# Patient Record
Sex: Female | Born: 1985 | Race: White | Hispanic: No | Marital: Single | Smoking: Current every day smoker
Health system: Southern US, Community
[De-identification: ages and names within clinical notes are randomized; demographics above are authoritative.]

## PROBLEM LIST (undated history)

## (undated) DIAGNOSIS — C539 Malignant neoplasm of cervix uteri, unspecified: Secondary | ICD-10-CM

## (undated) DIAGNOSIS — N946 Dysmenorrhea, unspecified: Secondary | ICD-10-CM

## (undated) DIAGNOSIS — N920 Excessive and frequent menstruation with regular cycle: Secondary | ICD-10-CM

## (undated) HISTORY — PX: CERVICAL BIOPSY  W/ LOOP ELECTRODE EXCISION: SUR135

## (undated) HISTORY — DX: Excessive and frequent menstruation with regular cycle: N92.0

## (undated) HISTORY — PX: FRACTURE SURGERY: SHX138

## (undated) HISTORY — DX: Dysmenorrhea, unspecified: N94.6

---

## 2005-11-03 ENCOUNTER — Ambulatory Visit: Payer: Self-pay | Admitting: Family Medicine

## 2006-01-13 ENCOUNTER — Ambulatory Visit: Payer: Self-pay | Admitting: Nurse Practitioner

## 2006-11-20 ENCOUNTER — Emergency Department: Payer: Self-pay | Admitting: Unknown Physician Specialty

## 2008-06-08 ENCOUNTER — Emergency Department: Payer: Self-pay | Admitting: Emergency Medicine

## 2010-01-31 ENCOUNTER — Emergency Department: Payer: Self-pay | Admitting: Emergency Medicine

## 2011-09-05 ENCOUNTER — Emergency Department: Payer: Self-pay | Admitting: Emergency Medicine

## 2013-09-03 ENCOUNTER — Ambulatory Visit: Payer: Self-pay

## 2013-11-11 ENCOUNTER — Ambulatory Visit: Payer: Self-pay | Admitting: Obstetrics and Gynecology

## 2013-11-11 LAB — HEMOGLOBIN: HGB: 13.3 g/dL (ref 12.0–16.0)

## 2013-11-17 ENCOUNTER — Ambulatory Visit: Payer: Self-pay | Admitting: Obstetrics and Gynecology

## 2013-11-21 LAB — PATHOLOGY REPORT

## 2014-05-22 ENCOUNTER — Emergency Department: Payer: Self-pay | Admitting: Emergency Medicine

## 2014-11-28 NOTE — Op Note (Signed)
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  OPERATION: LEEP  ANESTHESIA: General.   SURGEON: Dr. Rubie Maid  ESTIMATED BLOOD LOSS:  Minimal.   OPERATIVE FLUIDS: 700 mL.   URINE OUTPUT: 300 mL.   COMPLICATIONS: None.   FINDINGS: Decreased Lugol uptake at 9 o'clock and 12 o'clock regions.   SPECIMEN: LEEP specimen (endocervix and ectocervix), and ECC.   CONDITION: Stable.   DESCRIPTION OF PROCEDURE: The patient was taken to the Operating Room, where she was placed under general anesthesia without difficulty. She was then placed in the dorsal lithotomy position, and prepped and draped in normal sterile fashion. A straight catheterization was performed. A Graves speculum was then placed in the vagina. Lugol solution was painted along the entire cervix and vaginal wall. Areas of non-uptake were noted as above. Approximately 10 mL of diluted vasopressin with epinephrine in a one-to-one ratio was injected circumferentially along the cervix. The large loop electrode was used to remove the specimen, which was excised and sent to Pathology. The smallest loop electrode was obtained to obtain a top hat for excision of the endocervix. Next, an endocervical curettage was performed. The specimen was placed on a Telfa pad and sent to Pathology. The bed of the excised cervical tissue along the cervix was cauterized using the rollerball. Hemostasis was noted. Monsel solution was applied to the cervix. All instruments were removed from the vagina.  The patient tolerated the procedure well, without complication. She was taken to the recovery room in stable condition.    ____________________________ Chesley Noon Marcelline Mates, MD asc:mr D: 11/17/2013 16:10:29 ET T: 11/17/2013 20:19:35 ET JOB#: [id number]  cc: Chesley Noon. Marcelline Mates, MD, <Dictator> Augusto Gamble  MD ELECTRONICALLY SIGNED 11/23/2013 17:40

## 2015-06-02 ENCOUNTER — Ambulatory Visit
Admission: EM | Admit: 2015-06-02 | Discharge: 2015-06-02 | Disposition: A | Discharge status: Home / Self Care | Payer: Self-pay | Attending: Family Medicine | Admitting: Family Medicine

## 2015-06-02 DIAGNOSIS — N390 Urinary tract infection, site not specified: Secondary | ICD-10-CM

## 2015-06-02 DIAGNOSIS — A599 Trichomoniasis, unspecified: Secondary | ICD-10-CM

## 2015-06-02 HISTORY — DX: Malignant neoplasm of cervix uteri, unspecified: C53.9

## 2015-06-02 LAB — URINALYSIS COMPLETE WITH MICROSCOPIC (ARMC ONLY)
BILIRUBIN URINE: NEGATIVE
GLUCOSE, UA: NEGATIVE mg/dL
Ketones, ur: NEGATIVE mg/dL
Nitrite: NEGATIVE
PH: 7 (ref 5.0–8.0)
Protein, ur: 30 mg/dL — AB
Specific Gravity, Urine: 1.015 (ref 1.005–1.030)

## 2015-06-02 LAB — PREGNANCY, URINE: Preg Test, Ur: NEGATIVE

## 2015-06-02 MED ORDER — METRONIDAZOLE 500 MG PO TABS: ORAL_TABLET | ORAL | Status: DC

## 2015-06-02 MED ORDER — CIPROFLOXACIN HCL 250 MG PO TABS: 250.0000 mg | ORAL_TABLET | Freq: Two times a day (BID) | ORAL | Status: DC

## 2015-06-02 MED ORDER — ONDANSETRON 8 MG PO TBDP
8.0000 mg | ORAL_TABLET | Freq: Once | ORAL | Status: AC
  Administered 2015-06-02: 8 mg via ORAL

## 2015-06-02 MED ORDER — ONDANSETRON 8 MG PO TBDP: 8.0000 mg | ORAL_TABLET | Freq: Three times a day (TID) | ORAL | Status: DC | PRN

## 2015-06-02 NOTE — ED Provider Notes (Signed)
CSN: [id number]     Arrival date & time 06/02/15  1812 History   First MD Initiated Contact with Patient 06/02/15 1911     Chief Complaint  Patient presents with  . Nausea   (Consider location/radiation/quality/duration/timing/severity/associated sxs/prior Treatment) HPI Comments: 29 yo female with a 2 days h/o fatigue, lower abdominal discomfort and nausea. Denies any fevers, chills, vaginal discharge, vomiting, diarrhea, constipation. Possible sick contact; states father had a gastrointestinal infection last week with vomiting and diarrhea.  The history is provided by the patient.    Past Medical History  Diagnosis Date  . Cervical cancer [facility])    Past Surgical History  Procedure Laterality Date  . Cervical biopsy  w/ loop electrode excision    . Fracture surgery     No family history on file. Social History  Substance Use Topics  . Smoking status: Current Every Day Smoker -- 0.50 packs/day  . Smokeless tobacco: Never Used  . Alcohol Use: Yes     Comment: rare   OB History    Gravida Para Term Preterm AB TAB SAB Ectopic Multiple Living   0 0 0 0 0 0 0 0 0 0      Review of Systems  Allergies  Codeine  Home Medications   Prior to Admission medications   Medication Sig Start Date End Date Taking? Authorizing Provider  ciprofloxacin (CIPRO) 250 MG tablet Take 1 tablet (250 mg total) by mouth every 12 (twelve) hours. 06/02/15   Norval Gable, MD  metroNIDAZOLE (FLAGYL) 500 MG tablet Take 4 tabs po once 06/02/15   Norval Gable, MD  ondansetron (ZOFRAN ODT) 8 MG disintegrating tablet Take 1 tablet (8 mg total) by mouth every 8 (eight) hours as needed for nausea or vomiting. 06/02/15   Norval Gable, MD   Meds Ordered and Administered this Visit   Medications  ondansetron (ZOFRAN-ODT) disintegrating tablet 8 mg (8 mg Oral Given 06/02/15 1910)    BP 110/71 mmHg  Pulse 66  Temp(Src) 98.6 F (37 C) (Oral)  Resp 16  Ht 5\' 6"  (1.676 m)  Wt 209 lb 9.6 oz (95.074 kg)   BMI 33.85 kg/m2  SpO2 100%  LMP 05/18/2015 No data found.   Physical Exam  Constitutional: She appears well-developed and well-nourished. No distress.  Cardiovascular: Normal rate.   Pulmonary/Chest: Effort normal. No respiratory distress.  Abdominal: Soft. Bowel sounds are normal. She exhibits no distension and no mass. There is no tenderness. There is no rebound and no guarding.  Skin: No rash noted. She is not diaphoretic.  Nursing note and vitals reviewed.   ED Course  Procedures (including critical care time)  Labs Review Labs Reviewed  URINALYSIS COMPLETEWITH MICROSCOPIC ([place] ONLY) - Abnormal; Notable for the following:    Color, Urine STRAW (*)    Hgb urine dipstick 2+ (*)    Protein, ur 30 (*)    Leukocytes, UA 1+ (*)    Bacteria, UA FEW (*)    Squamous Epithelial / LPF 0-5 (*)    All other components within normal limits  URINE CULTURE  CHLAMYDIA/NGC RT PCR ([facility] ONLY)  PREGNANCY, URINE    Imaging Review No results found.   Visual Acuity Review  Right Eye Distance:   Left Eye Distance:   Bilateral Distance:    Right Eye Near:   Left Eye Near:    Bilateral Near:         MDM   1. Trichomonas infection   2. UTI (lower urinary tract infection)  Discharge Medication List as of 06/02/2015  8:02 PM    START taking these medications   Details  ciprofloxacin (CIPRO) 250 MG tablet Take 1 tablet (250 mg total) by mouth every 12 (twelve) hours., Starting 06/02/2015, Until Discontinued, Print    metroNIDAZOLE (FLAGYL) 500 MG tablet Take 4 tabs po once, Print    ondansetron (ZOFRAN ODT) 8 MG disintegrating tablet Take 1 tablet (8 mg total) by mouth every 8 (eight) hours as needed for nausea or vomiting., Starting 06/02/2015, Until Discontinued, Print      1. Lab results and diagnosis reviewed with patient 2. rx as per orders above; reviewed possible side effects, in eractions, risks and benefits  3. Recommend supportive treatment with  increased fluids; recommend patient notify partner to get checked as well 4. Check urine culture, GC/chlamydia 5. Patient given zofran 8mg  po x 1 with improvement of nausea 6. Follow-up prn if symptoms worsen or don't improve    Norval Gable, MD 06/02/15 2032

## 2015-06-02 NOTE — ED Notes (Signed)
Nausea, no appetite, lower bilateral abdominal/back pain, pallor, fatigue, lightheadedness, chills/sweats, insomnia x 2 days. Pt reports her father has been sick, and pt is taking Mucinex and Airborne to prevent illness. Pt reports her period on 05/28/2015, which lasted longer and was heavier than usual. Pt feels this may be related to her current illness.

## 2015-06-03 LAB — CHLAMYDIA/NGC RT PCR (ARMC ONLY)
CHLAMYDIA TR: DETECTED — AB
N gonorrhoeae: NOT DETECTED

## 2015-06-04 ENCOUNTER — Ambulatory Visit
Admission: EM | Admit: 2015-06-04 | Discharge: 2015-06-04 | Disposition: A | Discharge status: Home / Self Care | Payer: Self-pay | Attending: Family Medicine | Admitting: Family Medicine

## 2015-06-04 ENCOUNTER — Telehealth: Payer: Self-pay | Admitting: Emergency Medicine

## 2015-06-04 LAB — URINE CULTURE: Special Requests: NORMAL

## 2015-06-04 MED ORDER — AZITHROMYCIN 250 MG PO TABS: ORAL_TABLET | ORAL | Status: DC

## 2015-06-04 MED ORDER — AZITHROMYCIN 500 MG PO TABS: 1000.0000 mg | ORAL_TABLET | Freq: Once | ORAL | Status: DC

## 2015-06-04 NOTE — ED Notes (Signed)
Patient was notified that her Chlamydia test came back positive.  Patient was instructed to continue to take her Cipro and Flagyl until finished.  Patient was notified that a prescription for Azithromycin was sent to her pharmacy at [facility] in [facility] by Dr. Zenda Alpers.  Patient was instructed to take her antibiotic as soon as possible to treat for her Chlamydia infection.  Patient states that her abdominal pain has not improved.  Patient was instructed that she could follow-up here or with her PCP for further evaluation.  Patient also was informed that her urine culture was not growing any bacteria at this time.  Patient verbalized understanding.

## 2015-06-04 NOTE — ED Notes (Signed)
Patient here today to receive her dose of Azithromycin 1000mg  once to treat for her positive Chlamydia infection.

## 2016-05-01 DIAGNOSIS — O3440 Maternal care for other abnormalities of cervix, unspecified trimester: Secondary | ICD-10-CM | POA: Insufficient documentation

## 2016-05-01 DIAGNOSIS — Z9889 Other specified postprocedural states: Secondary | ICD-10-CM

## 2016-05-01 DIAGNOSIS — Z3400 Encounter for supervision of normal first pregnancy, unspecified trimester: Secondary | ICD-10-CM | POA: Insufficient documentation

## 2016-05-30 DIAGNOSIS — O99330 Smoking (tobacco) complicating pregnancy, unspecified trimester: Secondary | ICD-10-CM | POA: Insufficient documentation

## 2016-06-06 ENCOUNTER — Emergency Department
Admission: EM | Admit: 2016-06-06 | Discharge: 2016-06-06 | Disposition: A | Discharge status: Home / Self Care | Payer: Medicaid Other | Attending: Emergency Medicine | Admitting: Emergency Medicine

## 2016-06-06 ENCOUNTER — Encounter: Payer: Self-pay | Admitting: Emergency Medicine

## 2016-06-06 ENCOUNTER — Emergency Department: Payer: Medicaid Other

## 2016-06-06 DIAGNOSIS — Z792 Long term (current) use of antibiotics: Secondary | ICD-10-CM | POA: Diagnosis not present

## 2016-06-06 DIAGNOSIS — Z8541 Personal history of malignant neoplasm of cervix uteri: Secondary | ICD-10-CM | POA: Diagnosis not present

## 2016-06-06 DIAGNOSIS — F17200 Nicotine dependence, unspecified, uncomplicated: Secondary | ICD-10-CM | POA: Insufficient documentation

## 2016-06-06 DIAGNOSIS — Z3A14 14 weeks gestation of pregnancy: Secondary | ICD-10-CM | POA: Diagnosis not present

## 2016-06-06 DIAGNOSIS — O200 Threatened abortion: Secondary | ICD-10-CM | POA: Diagnosis not present

## 2016-06-06 DIAGNOSIS — O4692 Antepartum hemorrhage, unspecified, second trimester: Secondary | ICD-10-CM | POA: Diagnosis present

## 2016-06-06 LAB — URINALYSIS COMPLETE WITH MICROSCOPIC (ARMC ONLY)
Bilirubin Urine: NEGATIVE
Glucose, UA: NEGATIVE mg/dL
Ketones, ur: NEGATIVE mg/dL
LEUKOCYTES UA: NEGATIVE
Nitrite: NEGATIVE
PH: 7 (ref 5.0–8.0)
PROTEIN: 30 mg/dL — AB
Specific Gravity, Urine: 1.015 (ref 1.005–1.030)

## 2016-06-06 LAB — CBC
HEMATOCRIT: 36.7 % (ref 35.0–47.0)
Hemoglobin: 13 g/dL (ref 12.0–16.0)
MCH: 31.5 pg (ref 26.0–34.0)
MCHC: 35.6 g/dL (ref 32.0–36.0)
MCV: 88.6 fL (ref 80.0–100.0)
PLATELETS: 384 10*3/uL (ref 150–440)
RBC: 4.14 MIL/uL (ref 3.80–5.20)
RDW: 13.7 % (ref 11.5–14.5)
WBC: 12.5 10*3/uL — AB (ref 3.6–11.0)

## 2016-06-06 LAB — BASIC METABOLIC PANEL
Anion gap: 9 (ref 5–15)
BUN: 6 mg/dL (ref 6–20)
CO2: 23 mmol/L (ref 22–32)
Calcium: 9.1 mg/dL (ref 8.9–10.3)
Chloride: 103 mmol/L (ref 101–111)
Creatinine, Ser: 0.37 mg/dL — ABNORMAL LOW (ref 0.44–1.00)
GFR calc Af Amer: >60 mL/min (ref >60)
GLUCOSE: 97 mg/dL (ref 65–99)
POTASSIUM: 3.3 mmol/L — AB (ref 3.5–5.1)
Sodium: 135 mmol/L (ref 135–145)

## 2016-06-06 LAB — HCG, QUANTITATIVE, PREGNANCY: hCG, Beta Chain, Quant, S: 39857 m[IU]/mL — ABNORMAL HIGH (ref <5)

## 2016-06-06 NOTE — ED Notes (Signed)
Patient transported to US 

## 2016-06-06 NOTE — ED Notes (Signed)
Pt stating that she has had bright red blood. Pt stating that she believes it started around 4 am. Pt stating that she noticed the blood in the toilet and on her bed sheets. Pt stating that she had some spotting before this. Pt stating that she has not had any morning sickness. Pt is [redacted] weeks pregnant. Pt stating that she has some lower abdominal pain and cramping with coughing.

## 2016-06-06 NOTE — ED Notes (Signed)
Signature pad not working.  Patient verbalized understanding of discharge instructions and has no further questions. 

## 2016-06-06 NOTE — ED Provider Notes (Signed)
[facility] Emergency Department Provider Note        Time seen: ----------------------------------------- 11:52 AM on 06/06/2016 -----------------------------------------    I have reviewed the triage vital signs and the nursing notes.   HISTORY  Chief Complaint Vaginal Bleeding    HPI Lauren Johnson is a 30 y.o. female who presents to ER for vaginal bleeding that began at 4 AM this morning. Patient reports she is [redacted] weeks pregnant, woke up bleeding. She reports when she urinated in the toilet that there was a lot of blood in the toilet but denies clots. Patient states she had been spotting previously with this pregnancy but never bleeding briskly like today. Patient is had some cramping but this has improved. She is G1 P0   Past Medical History:  Diagnosis Date  . Cervical cancer ([place])     There are no active problems to display for this patient.   Past Surgical History:  Procedure Laterality Date  . CERVICAL BIOPSY  W/ LOOP ELECTRODE EXCISION    . FRACTURE SURGERY      Allergies Codeine and Latex  Social History Social History  Substance Use Topics  . Smoking status: Current Every Day Smoker    Packs/day: 0.50  . Smokeless tobacco: Never Used  . Alcohol use Yes     Comment: rare    Review of Systems Constitutional: Negative for fever. Cardiovascular: Negative for chest pain. Respiratory: Negative for shortness of breath. Gastrointestinal: Negative for abdominal pain, vomiting and diarrhea. Genitourinary: Positive for pelvic pain, vaginal bleeding Musculoskeletal: Negative for back pain. Skin: Negative for rash. Neurological: Negative for headaches, focal weakness or numbness.  10-point ROS otherwise negative.  ____________________________________________   PHYSICAL EXAM:  VITAL SIGNS: ED Triage Vitals  Enc Vitals Group     BP 06/06/16 1007 125/79     Pulse Rate 06/06/16 1007 80     Resp 06/06/16 1007 16     Temp  06/06/16 1007 98.1 F (36.7 C)     Temp Source 06/06/16 1007 Oral     SpO2 06/06/16 1007 99 %     Weight 06/06/16 1008 224 lb (101.6 kg)     Height 06/06/16 1008 5\' 6"  (1.676 m)     Head Circumference --      Peak Flow --      Pain Score 06/06/16 1016 9     Pain Loc --      Pain Edu? --      Excl. in [place]? --     Constitutional: Alert and oriented. Well appearing and in no distress. Eyes: Conjunctivae are normal. PERRL. Normal extraocular movements. ENT   Head: Normocephalic and atraumatic.   Nose: No congestion/rhinnorhea.   Mouth/Throat: Mucous membranes are moist.   Neck: No stridor. Cardiovascular: Normal rate, regular rhythm. No murmurs, rubs, or gallops. Respiratory: Normal respiratory effort without tachypnea nor retractions. Breath sounds are clear and equal bilaterally. No wheezes/rales/rhonchi. Gastrointestinal: Soft and nontender. Normal bowel sounds Musculoskeletal: Nontender with normal range of motion in all extremities. No lower extremity tenderness nor edema. Neurologic:  Normal speech and language. No gross focal neurologic deficits are appreciated.  Skin:  Skin is warm, dry and intact. No rash noted. Psychiatric: Mood and affect are normal. Speech and behavior are normal.  ____________________________________________  ED COURSE:  Pertinent labs & imaging results that were available during my care of the patient were reviewed by me and considered in my medical decision making (see chart for details). Clinical Course  Patients in no distress, likely threatened miscarriage. We will assess with labs and ultrasound.  Procedures ____________________________________________   LABS (pertinent positives/negatives)  Labs Reviewed  HCG, QUANTITATIVE, PREGNANCY - Abnormal; Notable for the following:       Result Value   hCG, Beta Chain, Quant, S [id number] (*)    All other components within normal limits  CBC - Abnormal; Notable for the following:    WBC  12.5 (*)    All other components within normal limits  BASIC METABOLIC PANEL - Abnormal; Notable for the following:    Potassium 3.3 (*)    Creatinine, Ser 0.37 (*)    All other components within normal limits  URINALYSIS COMPLETEWITH MICROSCOPIC ([facility] ONLY)    RADIOLOGY  Pregnancy ultrasound IMPRESSION: There is single live intrauterine gestation fetal heart rate 130 BPM. No placenta previa. BPD measures 2.7 cm corresponding to gestational age [redacted] weeks 6 days. EDC by ultrasound 11/29/2016  This exam is performed on an emergent basis and does not comprehensively evaluate fetal size, dating, or anatomy; follow-up complete OB US should be considered if further fetal assessment is warranted.   Electronically Signed   By: Lahoma Crocker M.D.   On: 06/06/2016 12:53  ____________________________________________  FINAL ASSESSMENT AND PLAN  Threatened miscarriage  Plan: Patient with labs and imaging as dictated above. No clear etiology for her bleeding. She will be referred to close outpatient OB/GYN follow-up.   Earleen Newport, MD   Note: This dictation was prepared with Dragon dictation. Any transcriptional errors that result from this process are unintentional    Earleen Newport, MD 06/06/16 1316

## 2016-06-06 NOTE — ED Notes (Signed)
Pt returned from US

## 2016-06-06 NOTE — ED Triage Notes (Signed)
Pt presents to ED with reports of vaginal bleeding that began approximately 0400 this morning. Pt reports she is [redacted] weeks pregnant. Pt states she woke up in a pool of blood. Pt reports when she urinated the toilet was filled with blood but denies clots.

## 2016-06-10 LAB — OB RESULTS CONSOLE HGB/HCT, BLOOD
HCT: 39 %
Hemoglobin: 12.6 g/dL

## 2016-06-10 LAB — OB RESULTS CONSOLE RUBELLA ANTIBODY, IGM: RUBELLA: IMMUNE

## 2016-06-10 LAB — OB RESULTS CONSOLE ABO/RH
RH TYPE: POSITIVE
RH Type: NEGATIVE

## 2016-06-10 LAB — OB RESULTS CONSOLE GC/CHLAMYDIA
Chlamydia: NEGATIVE
GC PROBE AMP, GENITAL: NEGATIVE

## 2016-06-10 LAB — OB RESULTS CONSOLE PLATELET COUNT: Platelets: 394 10*3/uL

## 2016-06-10 LAB — OB RESULTS CONSOLE ANTIBODY SCREEN: ANTIBODY SCREEN: NEGATIVE

## 2016-06-10 LAB — OB RESULTS CONSOLE RPR: RPR: NONREACTIVE

## 2016-06-10 LAB — OB RESULTS CONSOLE HIV ANTIBODY (ROUTINE TESTING): HIV: NONREACTIVE

## 2016-06-13 DIAGNOSIS — O4692 Antepartum hemorrhage, unspecified, second trimester: Secondary | ICD-10-CM | POA: Insufficient documentation

## 2016-06-16 DIAGNOSIS — O285 Abnormal chromosomal and genetic finding on antenatal screening of mother: Secondary | ICD-10-CM | POA: Insufficient documentation

## 2016-06-22 DIAGNOSIS — O280 Abnormal hematological finding on antenatal screening of mother: Secondary | ICD-10-CM | POA: Insufficient documentation

## 2016-08-07 NOTE — L&D Delivery Note (Signed)
Cesarean Section Procedure Note  Indications: failure to progress: arrest of dilation and Preeclampsia  Pre-operative Diagnosis: 39 week 4 day pregnancy.  Post-operative Diagnosis: same and uterine atony  Surgeon: Alanda Slim Charmine Bockrath   Assistants: Philip Aspen, certified nurse midwife  Anesthesia: Spinal anesthesia  ASA Class: 2   Procedure Details   The patient was seen in the Holding Room. The risks, benefits, complications, treatment options, and expected outcomes were discussed with the patient.  The patient concurred with the proposed plan, giving informed consent.  The site of surgery properly noted/marked. The patient was taken to Operating Room # 1, identified as Lauren Johnson and the procedure verified as C-Section Delivery. A Time Out was held and the above information confirmed.  After induction of spinal anesthesia, the patient was draped and prepped in the usual sterile manner. A Pfannenstiel incision was made and carried down through the subcutaneous tissue to the fascia. Fascial incision was made and extended transversely. The fascia was separated.The peritoneum was identified and entered. Peritoneal incision was extended longitudinally. The utero-vesical peritoneal reflection was incised transversely and the bladder flap was bluntly freed from the lower uterine segment. A low transverse uterine incision was made. Delivered from cephalic presentation was a 2430 gram Female with Apgar scores of 8 at one minute and 9 at five minutes. After the umbilical cord was clamped and cut cord blood was obtained for evaluation. The placenta was removed intact and appeared normal. The uterine outline, tubes and ovaries appeared normal. The uterine incision was closed with running locked sutures of #1 chromic. Uterine atony was noted. Initial attempt at Cytotec rectally was unsuccessful; 250 g of Hemabate was then given intra muscularly with improvement in uterine tone. The fascia was  then reapproximated with running sutures of 0 Maxon. The subcutaneous tissues were reapproximated using 2-0 Vicryl in a running manner The skin was reapproximated with 3.0 and Vicryl.  Instrument, sponge, and needle counts were correct prior the abdominal closure and at the conclusion of the case.   Findings: Viable female infant 2430 g (5 lbs. 6 oz.)  Estimated Blood Loss:  750 mL         Drains: None         Total IV Fluids:  2000 ml         Specimens: None           Implants: None         Complications:  None; patient tolerated the procedure well.         Disposition: PACU - hemodynamically stable.         Condition: stable  Attending Attestation: I performed the procedure.  Brayton Mars, MD  Note: This dictation was prepared with Dragon dictation along with smaller phrase technology. Any transcriptional errors that result from this process are unintentional.

## 2016-08-15 ENCOUNTER — Ambulatory Visit (INDEPENDENT_AMBULATORY_CARE_PROVIDER_SITE_OTHER): Payer: Medicaid Other | Admitting: Certified Nurse Midwife

## 2016-08-15 ENCOUNTER — Encounter: Payer: Self-pay | Admitting: Certified Nurse Midwife

## 2016-08-15 VITALS — BP 137/65 | HR 93 | Wt 225.1 lb

## 2016-08-15 DIAGNOSIS — Z3402 Encounter for supervision of normal first pregnancy, second trimester: Secondary | ICD-10-CM

## 2016-08-15 LAB — POCT URINALYSIS DIPSTICK
GLUCOSE UA: NEGATIVE
Protein, UA: NEGATIVE
SPEC GRAV UA: 1.015
pH, UA: 5

## 2016-08-15 NOTE — Progress Notes (Signed)
Pt is a 23 week transfer. Records have been scanned.

## 2016-08-15 NOTE — Progress Notes (Signed)
TRANSFER IN OB HISTORY AND PHYSICAL  SUBJECTIVE:       Lauren Johnson is a 31 y.o. G24P0000 female, Patient's last menstrual period was 02/21/2016., Estimated Date of Delivery: 11/27/2016., presents today for Transition of Prenatal Care.   Previously a patient of [facility]. [name] has moved back to [facility] and desires care close to home.   EPIC data migration from outside records is accomplished today.  Complaints today include: numbness and tingling in hands.  Gynecologic History Patient's last menstrual period was 02/21/2016. Normal   Contraception: Depo-Provera injections   Last Pap: 06/10/2016. Results were: normal  LEEP: 2013 or 2014.   Obstetric History OB History  Gravida Para Term Preterm AB Living  1 0 0 0 0 0  SAB TAB Ectopic Multiple Live Births  0 0 0 0      # Outcome Date GA Lbr Len/2nd Weight Sex Delivery Anes PTL Lv  1 Current               Past Medical History:  Diagnosis Date  . Cervical cancer [facility])     Past Surgical History:  Procedure Laterality Date  . CERVICAL BIOPSY  W/ LOOP ELECTRODE EXCISION    . FRACTURE SURGERY      Current Outpatient Prescriptions on File Prior to Visit  Medication Sig Dispense Refill  . ondansetron (ZOFRAN ODT) 8 MG disintegrating tablet Take 1 tablet (8 mg total) by mouth every 8 (eight) hours as needed for nausea or vomiting. 6 tablet 0   No current facility-administered medications on file prior to visit.     Allergies  Allergen Reactions  . Codeine Hives  . Latex Other (See Comments)    Throat swells    Social History   Social History  . Marital status: Single    Spouse name: N/A  . Number of children: N/A  . Years of education: N/A   Occupational History  . Not on file.   Social History Main Topics  . Smoking status: Current Every Day Smoker    Packs/day: 0.50  . Smokeless tobacco: Never Used  . Alcohol use Yes     Comment: rare  . Drug use: No  . Sexual activity: Not on  file   Other Topics Concern  . Not on file   Social History Narrative  . No narrative on file    No family history on file.  The following portions of the patient's history were reviewed and updated as appropriate: allergies, current medications, past OB history, past medical history, past surgical history, past family history, past social history, and problem list.    OBJECTIVE: Initial Physical Exam (New OB)  GENERAL APPEARANCE: alert, well appearing, in no apparent distress  HEAD: normocephalic, atraumatic  MOUTH: mucous membranes moist, pharynx normal without lesions  THYROID: Not Examined  BREASTS: not examined  LUNGS: clear to auscultation, no wheezes, rales or rhonchi, symmetric air entry  HEART: regular rate and rhythm, no murmurs  ABDOMEN: fundus soft, nontender 25 weeks size and FHT present  EXTREMITIES: no redness or tenderness in the calves or thighs  SKIN: normal coloration and turgor, no rashes, four (4) professional and one (1) unprofessional tattoo  LYMPH NODES: not examined  NEUROLOGIC: alert, oriented, normal speech, no focal findings or movement disorder noted  PELVIC EXAM not examined  CERVICAL LENGTH: 3.76 cm by [place] on 08/09/2016 at [place].   ASSESSMENT: Normal pregnancy  PLAN: Prenatal care Red flag symptoms and when to call reviewed  See orders   Diona Fanti, CNM

## 2016-08-15 NOTE — Patient Instructions (Signed)
Carpal Tunnel Syndrome Carpal tunnel syndrome is a condition that causes pain in your hand and arm. The carpal tunnel is a narrow area located on the palm side of your wrist. Repeated wrist motion or certain diseases may cause swelling within the tunnel. This swelling pinches the main nerve in the wrist (median nerve). What are the causes? This condition may be caused by:  Repeated wrist motions.  Wrist injuries.  Arthritis.  A cyst or tumor in the carpal tunnel.  Fluid buildup during pregnancy. Sometimes the cause of this condition is not known. What increases the risk? This condition is more likely to develop in:  People who have jobs that cause them to repeatedly move their wrists in the same motion, such as Art gallery manager.  Women.  People with certain conditions, such as:  Diabetes.  Obesity.  An underactive thyroid (hypothyroidism).  Kidney failure. What are the signs or symptoms? Symptoms of this condition include:  A tingling feeling in your fingers, especially in your thumb, index, and middle fingers.  Tingling or numbness in your hand.  An aching feeling in your entire arm, especially when your wrist and elbow are bent for long periods of time.  Wrist pain that goes up your arm to your shoulder.  Pain that goes down into your palm or fingers.  A weak feeling in your hands. You may have trouble grabbing and holding items. Your symptoms may feel worse during the night. How is this diagnosed? This condition is diagnosed with a medical history and physical exam. You may also have tests, including:  An electromyogram (EMG). This test measures electrical signals sent by your nerves into the muscles.  X-rays. How is this treated? Treatment for this condition includes:  Lifestyle changes. It is important to stop doing or modify the activity that caused your condition.  Physical or occupational therapy.  Medicines for pain and inflammation. This may  include medicine that is injected into your wrist.  A wrist splint.  Surgery. Follow these instructions at home: If you have a splint:  Wear it as told by your health care provider. Remove it only as told by your health care provider.  Loosen the splint if your fingers become numb and tingle, or if they turn cold and blue.  Keep the splint clean and dry. General instructions  Take over-the-counter and prescription medicines only as told by your health care provider.  Rest your wrist from any activity that may be causing your pain. If your condition is work related, talk to your employer about changes that can be made, such as getting a wrist pad to use while typing.  If directed, apply ice to the painful area:  Put ice in a plastic bag.  Place a towel between your skin and the bag.  Leave the ice on for 20 minutes, 2-3 times per day.  Keep all follow-up visits as told by your health care provider. This is important.  Do any exercises as told by your health care provider, physical therapist, or occupational therapist. Contact a health care provider if:  You have new symptoms.  Your pain is not controlled with medicines.  Your symptoms get worse. This information is not intended to replace advice given to you by your health care provider. Make sure you discuss any questions you have with your health care provider. Document Released: 07/21/2000 Document Revised: 12/02/2015 Document Reviewed: 12/09/2014 Elsevier Interactive Patient Education  2017 [place] [employer] P[employer] The second trimester is  from week 13 through week 28, month 4 through 6. This is often the time in pregnancy that you feel your best. Often times, morning sickness has lessened or quit. You may have more energy, and you may get hungry more often. Your unborn baby (fetus) is growing rapidly. At the end of the sixth month, he or she is about 9 inches long and weighs about 1 pounds. You will  likely feel the baby move (quickening) between 18 and 20 weeks of pregnancy. Follow these instructions at home:  Avoid all smoking, herbs, and alcohol. Avoid drugs not approved by your doctor.  Do not use any tobacco products, including cigarettes, chewing tobacco, and electronic cigarettes. If you need help quitting, ask your doctor. You may get counseling or other support to help you quit.  Only take medicine as told by your doctor. Some medicines are safe and some are not during pregnancy.  Exercise only as told by your doctor. Stop exercising if you start having cramps.  Eat regular, healthy meals.  Wear a good support bra if your breasts are tender.  Do not use hot tubs, steam rooms, or saunas.  Wear your seat belt when driving.  Avoid raw meat, uncooked cheese, and liter boxes and soil used by cats.  Take your prenatal vitamins.  Take 1500-2000 milligrams of calcium daily starting at the 20th week of pregnancy until you deliver your baby.  Try taking medicine that helps you poop (stool softener) as needed, and if your doctor approves. Eat more fiber by eating fresh fruit, vegetables, and whole grains. Drink enough fluids to keep your pee (urine) clear or pale yellow.  Take warm water baths (sitz baths) to soothe pain or discomfort caused by hemorrhoids. Use hemorrhoid cream if your doctor approves.  If you have puffy, bulging veins (varicose veins), wear support hose. Raise (elevate) your feet for 15 minutes, 3-4 times a day. Limit salt in your diet.  Avoid heavy lifting, wear low heals, and sit up straight.  Rest with your legs raised if you have leg cramps or low back pain.  Visit your dentist if you have not gone during your pregnancy. Use a soft toothbrush to brush your teeth. Be gentle when you floss.  You can have sex (intercourse) unless your doctor tells you not to.  Go to your doctor visits. Get help if:  You feel dizzy.  You have mild cramps or pressure in  your lower belly (abdomen).  You have a nagging pain in your belly area.  You continue to feel sick to your stomach (nauseous), throw up (vomit), or have watery poop (diarrhea).  You have bad smelling fluid coming from your vagina.  You have pain with peeing (urination). Get help right away if:  You have a fever.  You are leaking fluid from your vagina.  You have spotting or bleeding from your vagina.  You have severe belly cramping or pain.  You lose or gain weight rapidly.  You have trouble catching your breath and have chest pain.  You notice sudden or extreme puffiness (swelling) of your face, hands, ankles, feet, or legs.  You have not felt the baby move in over an hour.  You have severe headaches that do not go away with medicine.  You have vision changes. This information is not intended to replace advice given to you by your health care provider. Make sure you discuss any questions you have with your health care provider. Document Released: 10/18/2009 Document Revised: 12/30/2015  Document Reviewed: 09/24/2012 Elsevier Interactive Patient Education  2017 [employer].

## 2016-09-05 ENCOUNTER — Other Ambulatory Visit: Payer: Medicaid Other

## 2016-09-05 ENCOUNTER — Ambulatory Visit (INDEPENDENT_AMBULATORY_CARE_PROVIDER_SITE_OTHER): Payer: Medicaid Other | Admitting: Certified Nurse Midwife

## 2016-09-05 VITALS — BP 113/67 | HR 82 | Wt 225.1 lb

## 2016-09-05 DIAGNOSIS — Z23 Encounter for immunization: Secondary | ICD-10-CM | POA: Diagnosis not present

## 2016-09-05 DIAGNOSIS — Z1389 Encounter for screening for other disorder: Secondary | ICD-10-CM

## 2016-09-05 DIAGNOSIS — Z3402 Encounter for supervision of normal first pregnancy, second trimester: Secondary | ICD-10-CM

## 2016-09-05 LAB — POCT URINALYSIS DIPSTICK
Bilirubin, UA: NEGATIVE
Glucose, UA: NEGATIVE
Ketones, UA: NEGATIVE
NITRITE UA: POSITIVE
PH UA: 7.5
PROTEIN UA: NEGATIVE
Spec Grav, UA: 1.01
Urobilinogen, UA: NEGATIVE

## 2016-09-05 MED ORDER — CITRANATAL ASSURE 35-1 & 300 MG PO MISC: 1.0000 | Freq: Every day | ORAL | 6 refills | Status: DC

## 2016-09-05 NOTE — Addendum Note (Signed)
Addended by: Keturah Barre L on: 09/05/2016 03:52 PM   Modules accepted: Orders

## 2016-09-05 NOTE — Progress Notes (Signed)
Pt presents for OB visit. Has had some lower abdominal pain and noted also flatus at this time. 1hr. Glucose, H&H, Varicella. Declines flu vaccine. TDAP given and blood transfusion consent form signed.

## 2016-09-05 NOTE — Progress Notes (Signed)
ROB-Pt worked last night, complaints of round ligament pain. Discussed use of abdominal support and home treatment measures. Rx PNV and samples given. Glucola, H/H, and varicella titer today. CBC schedule and CBB information given. RTC x 2 weeks.

## 2016-09-05 NOTE — Patient Instructions (Addendum)
Round Ligament Pain Introduction The round ligament is a cord of muscle and tissue that helps to support the uterus. It can become a source of pain during pregnancy if it becomes stretched or twisted as the baby grows. The pain usually begins in the second trimester of pregnancy, and it can come and go until the baby is delivered. It is not a serious problem, and it does not cause harm to the baby. Round ligament pain is usually a short, sharp, and pinching pain, but it can also be a dull, lingering, and aching pain. The pain is felt in the lower side of the abdomen or in the groin. It usually starts deep in the groin and moves up to the outside of the hip area. Pain can occur with:  A sudden change in position.  Rolling over in bed.  Coughing or sneezing.  Physical activity. Follow these instructions at home: Watch your condition for any changes. Take these steps to help with your pain:  When the pain starts, relax. Then try:  Sitting down.  Flexing your knees up to your abdomen.  Lying on your side with one pillow under your abdomen and another pillow between your legs.  Sitting in a warm bath for 15-20 minutes or until the pain goes away.  Take over-the-counter and prescription medicines only as told by your health care provider.  Move slowly when you sit and stand.  Avoid long walks if they cause pain.  Stop or lessen your physical activities if they cause pain. Contact a health care provider if:  Your pain does not go away with treatment.  You feel pain in your back that you did not have before.  Your medicine is not helping. Get help right away if:  You develop a fever or chills.  You develop uterine contractions.  You develop vaginal bleeding.  You develop nausea or vomiting.  You develop diarrhea.  You have pain when you urinate. This information is not intended to replace advice given to you by your health care provider. Make sure you discuss any questions  you have with your health care provider. Document Released: 05/02/2008 Document Revised: 12/30/2015 Document Reviewed: 09/30/2014  2017 Elsevier  Third Trimester of Pregnancy The third trimester is from week 29 through week 42, months 7 through 9. This trimester is when your unborn baby (fetus) is growing very fast. At the end of the ninth month, the unborn baby is about 20 inches in length. It weighs about 6-10 pounds. Follow these instructions at home:  Avoid all smoking, herbs, and alcohol. Avoid drugs not approved by your doctor.  Do not use any tobacco products, including cigarettes, chewing tobacco, and electronic cigarettes. If you need help quitting, ask your doctor. You may get counseling or other support to help you quit.  Only take medicine as told by your doctor. Some medicines are safe and some are not during pregnancy.  Exercise only as told by your doctor. Stop exercising if you start having cramps.  Eat regular, healthy meals.  Wear a good support bra if your breasts are tender.  Do not use hot tubs, steam rooms, or saunas.  Wear your seat belt when driving.  Avoid raw meat, uncooked cheese, and liter boxes and soil used by cats.  Take your prenatal vitamins.  Take 1500-2000 milligrams of calcium daily starting at the 20th week of pregnancy until you deliver your baby.  Try taking medicine that helps you poop (stool softener) as needed, and if  your doctor approves. Eat more fiber by eating fresh fruit, vegetables, and whole grains. Drink enough fluids to keep your pee (urine) clear or pale yellow.  Take warm water baths (sitz baths) to soothe pain or discomfort caused by hemorrhoids. Use hemorrhoid cream if your doctor approves.  If you have puffy, bulging veins (varicose veins), wear support hose. Raise (elevate) your feet for 15 minutes, 3-4 times a day. Limit salt in your diet.  Avoid heavy lifting, wear low heels, and sit up straight.  Rest with your legs  raised if you have leg cramps or low back pain.  Visit your dentist if you have not gone during your pregnancy. Use a soft toothbrush to brush your teeth. Be gentle when you floss.  You can have sex (intercourse) unless your doctor tells you not to.  Do not travel far distances unless you must. Only do so with your doctor's approval.  Take prenatal classes.  Practice driving to the hospital.  Pack your hospital bag.  Prepare the baby's room.  Go to your doctor visits. Get help if:  You are not sure if you are in labor or if your water has broken.  You are dizzy.  You have mild cramps or pressure in your lower belly (abdominal).  You have a nagging pain in your belly area.  You continue to feel sick to your stomach (nauseous), throw up (vomit), or have watery poop (diarrhea).  You have bad smelling fluid coming from your vagina.  You have pain with peeing (urination). Get help right away if:  You have a fever.  You are leaking fluid from your vagina.  You are spotting or bleeding from your vagina.  You have severe belly cramping or pain.  You lose or gain weight rapidly.  You have trouble catching your breath and have chest pain.  You notice sudden or extreme puffiness (swelling) of your face, hands, ankles, feet, or legs.  You have not felt the baby move in over an hour.  You have severe headaches that do not go away with medicine.  You have vision changes. This information is not intended to replace advice given to you by your health care provider. Make sure you discuss any questions you have with your health care provider. Document Released: 10/18/2009 Document Revised: 12/30/2015 Document Reviewed: 09/24/2012 Elsevier Interactive Patient Education  2017 [employer].

## 2016-09-06 LAB — GLUCOSE TOLERANCE, 1 HOUR: Glucose, 1Hr PP: 121 mg/dL (ref 65–199)

## 2016-09-06 LAB — VARICELLA ZOSTER ANTIBODY, IGG: VARICELLA: 1093 {index} (ref >165)

## 2016-09-06 LAB — HEMOGLOBIN AND HEMATOCRIT, BLOOD
HEMATOCRIT: 31.5 % — AB (ref 34.0–46.6)
Hemoglobin: 10.7 g/dL — ABNORMAL LOW (ref 11.1–15.9)

## 2016-09-07 ENCOUNTER — Other Ambulatory Visit: Payer: Self-pay | Admitting: Certified Nurse Midwife

## 2016-09-07 ENCOUNTER — Other Ambulatory Visit: Payer: Self-pay | Admitting: Obstetrics and Gynecology

## 2016-09-07 DIAGNOSIS — O99019 Anemia complicating pregnancy, unspecified trimester: Secondary | ICD-10-CM | POA: Insufficient documentation

## 2016-09-07 DIAGNOSIS — O99013 Anemia complicating pregnancy, third trimester: Secondary | ICD-10-CM

## 2016-09-07 LAB — URINE CULTURE

## 2016-09-07 MED ORDER — NITROFURANTOIN MONOHYD MACRO 100 MG PO CAPS: 100.0000 mg | ORAL_CAPSULE | Freq: Two times a day (BID) | ORAL | 1 refills | Status: DC

## 2016-09-07 MED ORDER — FUSION PLUS PO CAPS: 1.0000 | ORAL_CAPSULE | Freq: Every day | ORAL | 1 refills | Status: DC

## 2016-09-08 ENCOUNTER — Encounter: Payer: Self-pay | Admitting: Certified Nurse Midwife

## 2016-09-08 ENCOUNTER — Other Ambulatory Visit: Payer: Self-pay | Admitting: Certified Nurse Midwife

## 2016-09-08 ENCOUNTER — Ambulatory Visit (INDEPENDENT_AMBULATORY_CARE_PROVIDER_SITE_OTHER): Payer: Medicaid Other | Admitting: Certified Nurse Midwife

## 2016-09-08 ENCOUNTER — Telehealth: Payer: Self-pay

## 2016-09-08 VITALS — BP 103/62 | HR 87 | Wt 222.3 lb

## 2016-09-08 DIAGNOSIS — Z3492 Encounter for supervision of normal pregnancy, unspecified, second trimester: Secondary | ICD-10-CM | POA: Diagnosis not present

## 2016-09-08 DIAGNOSIS — O2343 Unspecified infection of urinary tract in pregnancy, third trimester: Secondary | ICD-10-CM

## 2016-09-08 LAB — POCT URINALYSIS DIPSTICK
Bilirubin, UA: NEGATIVE
GLUCOSE UA: NEGATIVE
Ketones, UA: NEGATIVE
NITRITE UA: POSITIVE
PH UA: 5
Protein, UA: NEGATIVE
Spec Grav, UA: 1.01
UROBILINOGEN UA: NEGATIVE

## 2016-09-08 MED ORDER — CEFTRIAXONE SODIUM 500 MG IJ SOLR
500.0000 mg | Freq: Once | INTRAMUSCULAR | Status: AC
  Administered 2016-09-08: 500 mg via INTRAMUSCULAR

## 2016-09-08 NOTE — Addendum Note (Signed)
Addended by: Raliegh Ip on: 09/08/2016 04:48 PM   Modules accepted: Orders

## 2016-09-08 NOTE — Patient Instructions (Signed)
Pregnancy and Urinary Tract Infection °WHAT IS A URINARY TRACT INFECTION? °A urinary tract infection (UTI) is an infection of any part of the urinary tract. This includes the kidneys, the tubes that connect your kidneys to your bladder (ureters), the bladder, and the tube that carries urine out of your body (urethra). These organs make, store, and get rid of urine in the body. A UTI can be a bladder infection (cystitis) or a kidney infection (pyelonephritis). This infection may be caused by fungi, viruses, and bacteria. Bacteria are the most common cause of UTIs. °You are more likely to develop a UTI during pregnancy because: °· The physical and hormonal changes your body goes through can make it easier for bacteria to get into your urinary tract. °· Your growing baby puts pressure on your uterus and can affect urine flow. °DOES A UTI PLACE MY BABY AT RISK? °An untreated UTI during pregnancy could lead to a kidney infection, which can cause health problems that could affect your baby. Possible complications of an untreated UTI include: °· Having your baby before 37 weeks of pregnancy (premature). °· Having a baby with a low birth weight. °· Developing high blood pressure during pregnancy (preeclampsia). °WHAT ARE THE SYMPTOMS OF A UTI? °Symptoms of a UTI include: °· Fever. °· Frequent urination or passing small amounts of urine frequently. °· Needing to urinate urgently. °· Pain or a burning sensation with urination. °· Urine that smells bad or unusual. °· Cloudy urine. °· Pain in the lower abdomen or back. °· Trouble urinating. °· Blood in the urine. °· Vomiting or being less hungry than normal. °· Diarrhea or abdominal pain. °· Vaginal discharge. °WHAT ARE THE TREATMENT OPTIONS FOR A UTI DURING PREGNANCY? °Treatment for this condition may include: °· Antibiotic medicines that are safe to take during pregnancy. °· Other medicines to treat less common causes of UTI. °HOW CAN I PREVENT A UTI? °To prevent a UTI: °· Go  to the bathroom as soon as you feel the need. °· Always wipe from front to back. °· Wash your genital area with soap and warm water daily. °· Empty your bladder before and after sex. °· Wear cotton underwear. °· Limit your intake of high sugar foods or drinks, such as regular soda, juice, and sweets.. °· Drink 6-8 glasses of water daily. °· Do not wear tight-fitting pants. °· Do not douche or use deodorant sprays. °· Do not drink alcohol, caffeine, or carbonated drinks. These can irritate the bladder. °WHEN SHOULD I SEEK MEDICAL CARE? °Seek medical care if: °· Your symptoms do not improve or get worse. °· You have a fever after two days of treatment. °· You have a rash. °· You have abnormal vaginal discharge. °· You have back or side pain. °· You have chills. °· You have nausea and vomiting. °WHEN SHOULD I SEEK IMMEDIATE MEDICAL CARE? °Seek immediate medical care if you are pregnant and: °· You feel contractions in your uterus. °· You have lower belly pain. °· You have a gush of fluid from your vagina. °· You have blood in your urine. °· You are vomiting and cannot keep down any medicines or water. °This information is not intended to replace advice given to you by your health care provider. Make sure you discuss any questions you have with your health care provider. °Document Released: 11/18/2010 Document Revised: 12/27/2015 Document Reviewed: 06/14/2015 °Elsevier Interactive Patient Education © 2017 Elsevier Inc. ° °

## 2016-09-08 NOTE — Telephone Encounter (Signed)
I had called pt to inform her of need for Rocephin injection for UTI, because the ATB ordered is resistant. Pt also states she has had some vaginal spotting since Wednesday and is not sure whether or not it is vaginal or bladder.  Pt to come in today for an appt to see provider at 4pm.

## 2016-09-08 NOTE — Progress Notes (Signed)
Spotting after urinating since Wed. C/o some cramping. States she has increased her water intake. States she has a UTI also.

## 2016-09-08 NOTE — Progress Notes (Signed)
Problem visit-Pt reports spotting with wiping and blood in toilet after urination. Positive UTI, rocephin today. Encouraged hydration and Tylenol per packaged instructions. NuSwab collected. Cervix visually closed, no bleeding present. Reviewed red flag symptoms and when to call. RTC as previously scheduled or sooner if symptoms worsen or fail to improve.

## 2016-09-12 ENCOUNTER — Telehealth: Payer: Self-pay | Admitting: Certified Nurse Midwife

## 2016-09-12 NOTE — Telephone Encounter (Signed)
Pt aware [facility] will not pay for iron. Advised to purchase otc. Pt states she is not having any vaginal bleeding at this time. U/a- showed large blood on 2/2. Pt given rocephin inj for uti on 2/2. Advised to push fluids and cranberry juice. Monitor sx. If bleeding or cramps, pt is to let us know.

## 2016-09-12 NOTE — Telephone Encounter (Signed)
SHE WANTS TO KNOW IF THERE IS SOMETHING ELSE BESIDES FUSION +, IT WAS 76.39 AT THE PHARMACY AND SHE CANT AFFORD THAT. ([facility] [address])

## 2016-09-14 ENCOUNTER — Other Ambulatory Visit: Payer: Self-pay | Admitting: Certified Nurse Midwife

## 2016-09-14 MED ORDER — METRONIDAZOLE 500 MG PO TABS: 500.0000 mg | ORAL_TABLET | Freq: Two times a day (BID) | ORAL | 0 refills | Status: AC

## 2016-09-14 MED ORDER — TERCONAZOLE 0.4 % VA CREA: 1.0000 | TOPICAL_CREAM | Freq: Every day | VAGINAL | 0 refills | Status: DC

## 2016-09-19 ENCOUNTER — Ambulatory Visit (INDEPENDENT_AMBULATORY_CARE_PROVIDER_SITE_OTHER): Payer: Medicaid Other | Admitting: Certified Nurse Midwife

## 2016-09-19 ENCOUNTER — Encounter: Payer: Medicaid Other | Admitting: Certified Nurse Midwife

## 2016-09-19 ENCOUNTER — Encounter: Payer: Self-pay | Admitting: Certified Nurse Midwife

## 2016-09-19 VITALS — BP 112/75 | HR 86 | Wt 224.8 lb

## 2016-09-19 DIAGNOSIS — Z1389 Encounter for screening for other disorder: Secondary | ICD-10-CM

## 2016-09-19 DIAGNOSIS — Z3402 Encounter for supervision of normal first pregnancy, second trimester: Secondary | ICD-10-CM

## 2016-09-19 LAB — POCT URINALYSIS DIPSTICK
Bilirubin, UA: NEGATIVE
Glucose, UA: NEGATIVE
Ketones, UA: NEGATIVE
Nitrite, UA: NEGATIVE
SPEC GRAV UA: 1.01
Urobilinogen, UA: NEGATIVE
pH, UA: 6.5

## 2016-09-19 NOTE — Progress Notes (Signed)
Right wrist-bone sticking out slightly, this has occurred since she had carpel tunnel. This is painful and sometimes red in color.

## 2016-09-19 NOTE — Patient Instructions (Signed)
Carpal Tunnel Syndrome Introduction Carpal tunnel syndrome is a condition that causes pain in your hand and arm. The carpal tunnel is a narrow area that is on the palm side of your wrist. Repeated wrist motion or certain diseases may cause swelling in the tunnel. This swelling can pinch the main nerve in the wrist (median nerve). Follow these instructions at home: If you have a splint:  Wear it as told by your doctor. Remove it only as told by your doctor.  Loosen the splint if your fingers:  Become numb and tingle.  Turn blue and cold.  Keep the splint clean and dry. General instructions  Take over-the-counter and prescription medicines only as told by your doctor.  Rest your wrist from any activity that may be causing your pain. If needed, talk to your employer about changes that can be made in your work, such as getting a wrist pad to use while typing.  If directed, apply ice to the painful area:  Put ice in a plastic bag.  Place a towel between your skin and the bag.  Leave the ice on for 20 minutes, 2-3 times per day.  Keep all follow-up visits as told by your doctor. This is important.  Do any exercises as told by your doctor, physical therapist, or occupational therapist. Contact a doctor if:  You have new symptoms.  Medicine does not help your pain.  Your symptoms get worse. This information is not intended to replace advice given to you by your health care provider. Make sure you discuss any questions you have with your health care provider. Document Released: 07/13/2011 Document Revised: 12/30/2015 Document Reviewed: 12/09/2014  2017 Elsevier

## 2016-09-19 NOTE — Progress Notes (Signed)
ROB-Pt doing well. Discussed management of carpal tunnel syndrome during pregnancy. Reviewed red flag symptoms and when to call. RTC x 2 weeks for ROB

## 2016-10-03 ENCOUNTER — Encounter: Payer: Medicaid Other | Admitting: Certified Nurse Midwife

## 2016-10-06 ENCOUNTER — Encounter: Payer: Medicaid Other | Admitting: Certified Nurse Midwife

## 2016-10-20 ENCOUNTER — Encounter: Payer: Medicaid Other | Admitting: Certified Nurse Midwife

## 2016-10-23 ENCOUNTER — Ambulatory Visit (INDEPENDENT_AMBULATORY_CARE_PROVIDER_SITE_OTHER): Payer: Medicaid Other | Admitting: Certified Nurse Midwife

## 2016-10-23 VITALS — BP 105/77 | HR 81 | Wt 227.3 lb

## 2016-10-23 DIAGNOSIS — R772 Abnormality of alphafetoprotein: Secondary | ICD-10-CM

## 2016-10-23 DIAGNOSIS — G479 Sleep disorder, unspecified: Secondary | ICD-10-CM

## 2016-10-23 DIAGNOSIS — O99323 Drug use complicating pregnancy, third trimester: Secondary | ICD-10-CM

## 2016-10-23 DIAGNOSIS — Z3403 Encounter for supervision of normal first pregnancy, third trimester: Secondary | ICD-10-CM

## 2016-10-23 LAB — POCT URINALYSIS DIPSTICK
Bilirubin, UA: NEGATIVE
Glucose, UA: NEGATIVE
Ketones, UA: NEGATIVE
Nitrite, UA: NEGATIVE
PH UA: 8 (ref 5.0–8.0)
PROTEIN UA: NEGATIVE
Spec Grav, UA: 1.005 (ref 1.030–1.035)
UROBILINOGEN UA: NEGATIVE (ref ?–2.0)

## 2016-10-23 MED ORDER — ZOLPIDEM TARTRATE 5 MG PO TABS: 5.0000 mg | ORAL_TABLET | Freq: Every evening | ORAL | 0 refills | Status: DC | PRN

## 2016-10-23 NOTE — Patient Instructions (Addendum)
Braxton Hicks Contractions Contractions of the uterus can occur throughout pregnancy, but they are not always a sign that you are in labor. You may have practice contractions called Braxton Hicks contractions. These false labor contractions are sometimes confused with true labor. What are Braxton Hicks contractions? Braxton Hicks contractions are tightening movements that occur in the muscles of the uterus before labor. Unlike true labor contractions, these contractions do not result in opening (dilation) and thinning of the cervix. Toward the end of pregnancy (32-34 weeks), Braxton Hicks contractions can happen more often and may become stronger. These contractions are sometimes difficult to tell apart from true labor because they can be very uncomfortable. You should not feel embarrassed if you go to the hospital with false labor. Sometimes, the only way to tell if you are in true labor is for your health care provider to look for changes in the cervix. The health care provider will do a physical exam and may monitor your contractions. If you are not in true labor, the exam should show that your cervix is not dilating and your water has not broken. If there are no prenatal problems or other health problems associated with your pregnancy, it is completely safe for you to be sent home with false labor. You may continue to have Braxton Hicks contractions until you go into true labor. How can I tell the difference between true labor and false labor?  Differences ? False labor ? Contractions last 30-70 seconds.: Contractions are usually shorter and not as strong as true labor contractions. ? Contractions become very regular.: Contractions are usually irregular. ? Discomfort is usually felt in the top of the uterus, and it spreads to the lower abdomen and low back.: Contractions are often felt in the front of the lower abdomen and in the groin. ? Contractions do not go away with walking.: Contractions may  go away when you walk around or change positions while lying down. ? Contractions usually become more intense and increase in frequency.: Contractions get weaker and are shorter-lasting as time goes on. ? The cervix dilates and gets thinner.: The cervix usually does not dilate or become thin. Follow these instructions at home:  Take over-the-counter and prescription medicines only as told by your health care provider.  Keep up with your usual exercises and follow other instructions from your health care provider.  Eat and drink lightly if you think you are going into labor.  If Braxton Hicks contractions are making you uncomfortable: ? Change your position from lying down or resting to walking, or change from walking to resting. ? Sit and rest in a tub of warm water. ? Drink enough fluid to keep your urine clear or pale yellow. Dehydration may cause these contractions. ? Do slow and deep breathing several times an hour.  Keep all follow-up prenatal visits as told by your health care provider. This is important. Contact a health care provider if:  You have a fever.  You have continuous pain in your abdomen. Get help right away if:  Your contractions become stronger, more regular, and closer together.  You have fluid leaking or gushing from your vagina.  You pass blood-tinged mucus (bloody show).  You have bleeding from your vagina.  You have low back pain that you never had before.  You feel your baby's head pushing down and causing pelvic pressure.  Your baby is not moving inside you as much as it used to. Summary  Contractions that occur before labor are   called Braxton Hicks contractions, false labor, or practice contractions.  Braxton Hicks contractions are usually shorter, weaker, farther apart, and less regular than true labor contractions. True labor contractions usually become progressively stronger and regular and they become more frequent.  Manage discomfort from  [facility] contractions by changing position, resting in a warm bath, drinking plenty of water, or practicing deep breathing. This information is not intended to replace advice given to you by your health care provider. Make sure you discuss any questions you have with your health care provider. Document Released: 07/24/2005 Document Revised: 06/12/2016 Document Reviewed: 06/12/2016 Elsevier Interactive Patient Education  2017 [employer].  Zolpidem tablets What is this medicine? ZOLPIDEM (zole PI dem) is used to treat insomnia. This medicine helps you to fall asleep and sleep through the night. This medicine may be used for other purposes; ask your health care provider or pharmacist if you have questions. COMMON BRAND NAME(S): Ambien What should I tell my health care provider before I take this medicine? They need to know if you have any of these conditions: -depression -history of drug abuse or addiction -if you often drink alcohol -liver disease -lung or breathing disease -myasthenia gravis -sleep apnea -suicidal thoughts, plans, or attempt; a previous suicide attempt by you or a family member -an unusual or allergic reaction to zolpidem, other medicines, foods, dyes, or preservatives -pregnant or trying to get pregnant -breast-feeding How should I use this medicine? Take this medicine by mouth with a glass of water. Follow the directions on the prescription label. It is better to take this medicine on an empty stomach and only when you are ready for bed. Do not take your medicine more often than directed. If you have been taking this medicine for several weeks and suddenly stop taking it, you may get unpleasant withdrawal symptoms. Your doctor or health care professional may want to gradually reduce the dose. Do not stop taking this medicine on your own. Always follow your doctor or health care professional's advice. A special MedGuide will be given to you by the pharmacist with  each prescription and refill. Be sure to read this information carefully each time. Talk to your pediatrician regarding the use of this medicine in children. Special care may be needed. Overdosage: If you think you have taken too much of this medicine contact a poison control center or emergency room at once. NOTE: This medicine is only for you. Do not share this medicine with others. What if I miss a dose? This does not apply. This medicine should only be taken immediately before going to sleep. Do not take double or extra doses. What may interact with this medicine? -alcohol -antihistamines for allergy, cough and cold -certain medicines for anxiety or sleep -certain medicines for depression, like amitriptyline, fluoxetine, sertraline -certain medicines for fungal infections like ketoconazole and itraconazole -certain medicines for seizures like phenobarbital, primidone -ciprofloxacin -dietary supplements for sleep, like valerian or kava kava -general anesthetics like halothane, isoflurane, methoxyflurane, propofol -local anesthetics like lidocaine, pramoxine, tetracaine -medicines that relax muscles for surgery -narcotic medicines for pain -phenothiazines like chlorpromazine, mesoridazine, prochlorperazine, thioridazine -rifampin This list may not describe all possible interactions. Give your health care provider a list of all the medicines, herbs, non-prescription drugs, or dietary supplements you use. Also tell them if you smoke, drink alcohol, or use illegal drugs. Some items may interact with your medicine. What should I watch for while using this medicine? Visit your doctor or health care professional for regular checks  on your progress. Keep a regular sleep schedule by going to bed at about the same time each night. Avoid caffeine-containing drinks in the evening hours. When sleep medicines are used every night for more than a few weeks, they may stop working. Talk to your doctor if  you still have trouble sleeping. After taking this medicine for sleep, you may get up out of bed while not being fully awake and do an activity that you do not know you are doing. The next morning, you may have no memory of the event. Activities such as driving a car ("sleep-driving"), making and eating food, talking on the phone, sexual activity, and sleep-walking have been reported. Call your doctor right away if you find out you have done any of these activities. Do not take this medicine if you have used alcohol that evening or before bed or taken another medicine for sleep since your risk of doing these sleep-related activities will be increased. Wait for at least 8 hours after you take a dose before driving or doing other activities that require full mental alertness. Do not take this medicine unless you are able to stay in bed for a full night (7 to 8 hours) before you must be active again. You may have a decrease in mental alertness the day after use, even if you feel that you are fully awake. Tell your doctor if you will need to perform activities requiring full alertness, such as driving, the next day. Do not stand or sit up quickly after taking this medicine, especially if you are an older patient. This reduces the risk of dizzy or fainting spells. If you or your family notice any changes in your behavior, such as new or worsening depression, thoughts of harming yourself, anxiety, other unusual or disturbing thoughts, or memory loss, call your doctor right away. After you stop taking this medicine, you may have trouble falling asleep. This is called rebound insomnia. This problem usually goes away on its own after 1 or 2 nights. What side effects may I notice from receiving this medicine? Side effects that you should report to your doctor or health care professional as soon as possible: -allergic reactions like skin rash, itching or hives, swelling of the face, lips, or tongue -breathing  problems -changes in vision -confusion -depressed mood or other changes in moods or emotions -feeling faint or lightheaded, falls -hallucinations -loss of balance or coordination -loss of memory -numbness or tingling of the tongue -restlessness, excitability, or feelings of anxiety or agitation -signs and symptoms of liver injury like dark yellow or brown urine; general ill feeling or flu-like symptoms; light-colored stools; loss of appetite; nausea; right upper belly pain; unusually weak or tired; yellowing of the eyes or skin -suicidal thoughts -unusual activities while asleep like driving, eating, making phone calls, or sexual activity Side effects that usually do not require medical attention (report to your doctor or health care professional if they continue or are bothersome): -dizziness -drowsiness the day after you take this medicine -headache This list may not describe all possible side effects. Call your doctor for medical advice about side effects. You may report side effects to FDA at 1-800-FDA-1088. Where should I keep my medicine? Keep out of the reach of children. This medicine can be abused. Keep your medicine in a safe place to protect it from theft. Do not share this medicine with anyone. Selling or giving away this medicine is dangerous and against the law. This medicine may cause accidental overdose  and death if taken by other adults, children, or pets. Mix any unused medicine with a substance like cat litter or coffee grounds. Then throw the medicine away in a sealed container like a sealed bag or a coffee can with a lid. Do not use the medicine after the expiration date. Store at room temperature between 20 and 25 degrees C (68 and 77 degrees F). NOTE: This sheet is a summary. It may not cover all possible information. If you have questions about this medicine, talk to your doctor, pharmacist, or health care provider.  2018 Elsevier/Gold Standard (2015-10-27  14:38:20)

## 2016-10-23 NOTE — Progress Notes (Signed)
Lauren Johnson-Pt reports fatigue, leg swelling, back pain, and intermittent abdominal cramping. Encouraged abdominal support, compression socks, 80-100 ounces of water daily, Tylenol per package instructions, and abdominal supports. Reviewed red flag symptoms and when to call. Pt treating insomnia with THC, offered short course of Ambien to only use prn. UDS ordered. Reviewed red flag symptoms and when to cal. RTCx 1 week for growth scan with AFI, NST, and Lauren Johnson secondary to increased DSR.

## 2016-10-24 LAB — PAIN MGT SCRN (14 DRUGS), UR
AMPHETAMINE SCRN UR: NEGATIVE ng/mL
BARBITURATE SCRN UR: NEGATIVE ng/mL
BENZODIAZEPINE SCREEN, URINE: NEGATIVE ng/mL
Buprenorphine, Urine: NEGATIVE ng/mL
CANNABINOIDS UR QL SCN: POSITIVE ng/mL
Cocaine(Metab.)Screen, Urine: NEGATIVE ng/mL
Creatinine(Crt), U: 29.3 mg/dL (ref 20.0–300.0)
FENTANYL, URINE: NEGATIVE pg/mL
Meperidine Screen, Urine: NEGATIVE ng/mL
Methadone Scn, Ur: NEGATIVE ng/mL
OXYCODONE+OXYMORPHONE UR QL SCN: NEGATIVE ng/mL
Opiate Scrn, Ur: NEGATIVE ng/mL
PCP Scrn, Ur: NEGATIVE ng/mL
PH UR, DRUG SCRN: 7 (ref 4.5–8.9)
Propoxyphene, Screen: NEGATIVE ng/mL
Tramadol Ur Ql Scn: NEGATIVE ng/mL

## 2016-10-26 ENCOUNTER — Encounter: Payer: Medicaid Other | Admitting: Certified Nurse Midwife

## 2016-10-30 ENCOUNTER — Encounter: Payer: Medicaid Other | Admitting: Certified Nurse Midwife

## 2016-10-30 ENCOUNTER — Other Ambulatory Visit: Payer: Medicaid Other

## 2016-11-01 ENCOUNTER — Other Ambulatory Visit: Payer: Self-pay | Admitting: Certified Nurse Midwife

## 2016-11-01 DIAGNOSIS — R772 Abnormality of alphafetoprotein: Secondary | ICD-10-CM

## 2016-11-02 ENCOUNTER — Encounter: Payer: Self-pay | Admitting: Certified Nurse Midwife

## 2016-11-02 ENCOUNTER — Ambulatory Visit (INDEPENDENT_AMBULATORY_CARE_PROVIDER_SITE_OTHER): Payer: Medicaid Other | Admitting: Certified Nurse Midwife

## 2016-11-02 ENCOUNTER — Ambulatory Visit (INDEPENDENT_AMBULATORY_CARE_PROVIDER_SITE_OTHER): Payer: Medicaid Other

## 2016-11-02 VITALS — BP 131/73 | HR 77 | Wt 227.5 lb

## 2016-11-02 DIAGNOSIS — R772 Abnormality of alphafetoprotein: Secondary | ICD-10-CM | POA: Diagnosis not present

## 2016-11-02 DIAGNOSIS — R8299 Other abnormal findings in urine: Secondary | ICD-10-CM

## 2016-11-02 DIAGNOSIS — Z369 Encounter for antenatal screening, unspecified: Secondary | ICD-10-CM

## 2016-11-02 DIAGNOSIS — R82998 Other abnormal findings in urine: Secondary | ICD-10-CM

## 2016-11-02 DIAGNOSIS — Z113 Encounter for screening for infections with a predominantly sexual mode of transmission: Secondary | ICD-10-CM

## 2016-11-02 DIAGNOSIS — Z3403 Encounter for supervision of normal first pregnancy, third trimester: Secondary | ICD-10-CM | POA: Diagnosis not present

## 2016-11-02 LAB — POCT URINALYSIS DIPSTICK
Bilirubin, UA: NEGATIVE
Glucose, UA: NEGATIVE
KETONES UA: NEGATIVE
NITRITE UA: NEGATIVE
PROTEIN UA: NEGATIVE
SPEC GRAV UA: 1.01 (ref 1.030–1.035)
UROBILINOGEN UA: NEGATIVE (ref ?–2.0)
pH, UA: 6.5 (ref 5.0–8.0)

## 2016-11-02 NOTE — Patient Instructions (Addendum)
Braxton Hicks Contractions Contractions of the uterus can occur throughout pregnancy, but they are not always a sign that you are in labor. You may have practice contractions called Braxton Hicks contractions. These false labor contractions are sometimes confused with true labor. What are Braxton Hicks contractions? Braxton Hicks contractions are tightening movements that occur in the muscles of the uterus before labor. Unlike true labor contractions, these contractions do not result in opening (dilation) and thinning of the cervix. Toward the end of pregnancy (32-34 weeks), Braxton Hicks contractions can happen more often and may become stronger. These contractions are sometimes difficult to tell apart from true labor because they can be very uncomfortable. You should not feel embarrassed if you go to the hospital with false labor. Sometimes, the only way to tell if you are in true labor is for your health care provider to look for changes in the cervix. The health care provider will do a physical exam and may monitor your contractions. If you are not in true labor, the exam should show that your cervix is not dilating and your water has not broken. If there are no prenatal problems or other health problems associated with your pregnancy, it is completely safe for you to be sent home with false labor. You may continue to have Braxton Hicks contractions until you go into true labor. How can I tell the difference between true labor and false labor?  Differences ? False labor ? Contractions last 30-70 seconds.: Contractions are usually shorter and not as strong as true labor contractions. ? Contractions become very regular.: Contractions are usually irregular. ? Discomfort is usually felt in the top of the uterus, and it spreads to the lower abdomen and low back.: Contractions are often felt in the front of the lower abdomen and in the groin. ? Contractions do not go away with walking.: Contractions may  go away when you walk around or change positions while lying down. ? Contractions usually become more intense and increase in frequency.: Contractions get weaker and are shorter-lasting as time goes on. ? The cervix dilates and gets thinner.: The cervix usually does not dilate or become thin. Follow these instructions at home:  Take over-the-counter and prescription medicines only as told by your health care provider.  Keep up with your usual exercises and follow other instructions from your health care provider.  Eat and drink lightly if you think you are going into labor.  If Braxton Hicks contractions are making you uncomfortable: ? Change your position from lying down or resting to walking, or change from walking to resting. ? Sit and rest in a tub of warm water. ? Drink enough fluid to keep your urine clear or pale yellow. Dehydration may cause these contractions. ? Do slow and deep breathing several times an hour.  Keep all follow-up prenatal visits as told by your health care provider. This is important. Contact a health care provider if:  You have a fever.  You have continuous pain in your abdomen. Get help right away if:  Your contractions become stronger, more regular, and closer together.  You have fluid leaking or gushing from your vagina.  You pass blood-tinged mucus (bloody show).  You have bleeding from your vagina.  You have low back pain that you never had before.  You feel your baby's head pushing down and causing pelvic pressure.  Your baby is not moving inside you as much as it used to. Summary  Contractions that occur before labor are   called Braxton Hicks contractions, false labor, or practice contractions.  Braxton Hicks contractions are usually shorter, weaker, farther apart, and less regular than true labor contractions. True labor contractions usually become progressively stronger and regular and they become more frequent.  Manage discomfort from  [facility] contractions by changing position, resting in a warm bath, drinking plenty of water, or practicing deep breathing. This information is not intended to replace advice given to you by your health care provider. Make sure you discuss any questions you have with your health care provider. Document Released: 07/24/2005 Document Revised: 06/12/2016 Document Reviewed: 06/12/2016 Elsevier Interactive Patient Education  2017 [employer].  Fetal Movement Counts Patient Name: ________________________________________________ Patient Due Date: ____________________ What is a fetal movement count? A fetal movement count is the number of times that you feel your baby move during a certain amount of time. This may also be called a fetal kick count. A fetal movement count is recommended for every pregnant woman. You may be asked to start counting fetal movements as early as week 28 of your pregnancy. Pay attention to when your baby is most active. You may notice your baby's sleep and wake cycles. You may also notice things that make your baby move more. You should do a fetal movement count:  When your baby is normally most active.  At the same time each day. A good time to count movements is while you are resting, after having something to eat and drink. How do I count fetal movements? 1. Find a quiet, comfortable area. Sit, or lie down on your side. 2. Write down the date, the start time and stop time, and the number of movements that you felt between those two times. Take this information with you to your health care visits. 3. For 2 hours, count kicks, flutters, swishes, rolls, and jabs. You should feel at least 10 movements during 2 hours. 4. You may stop counting after you have felt 10 movements. 5. If you do not feel 10 movements in 2 hours, have something to eat and drink. Then, keep resting and counting for 1 hour. If you feel at least 4 movements during that hour, you may stop  counting. Contact a health care provider if:  You feel fewer than 4 movements in 2 hours.  Your baby is not moving like he or she usually does. Date: ____________ Start time: ____________ Stop time: ____________ Movements: ____________ Date: ____________ Start time: ____________ Stop time: ____________ Movements: ____________ Date: ____________ Start time: ____________ Stop time: ____________ Movements: ____________ Date: ____________ Start time: ____________ Stop time: ____________ Movements: ____________ Date: ____________ Start time: ____________ Stop time: ____________ Movements: ____________ Date: ____________ Start time: ____________ Stop time: ____________ Movements: ____________ Date: ____________ Start time: ____________ Stop time: ____________ Movements: ____________ Date: ____________ Start time: ____________ Stop time: ____________ Movements: ____________ Date: ____________ Start time: ____________ Stop time: ____________ Movements: ____________ This information is not intended to replace advice given to you by your health care provider. Make sure you discuss any questions you have with your health care provider. Document Released: 08/23/2006 Document Revised: 03/22/2016 Document Reviewed: 09/02/2015 Elsevier Interactive Patient Education  2017 [employer].

## 2016-11-02 NOTE — Progress Notes (Signed)
ROB-Pt doing well. Missed previous appointment due to miscommunication with aunt. Ambien Rx sent to [facility] [place], advised pt to call and have Rx transferred to [place]. 36 week cultures collected. NST performed today was reviewed and was found to be reactive. Baseline 135 with moderate variability, positive accelerations, and no decerlerations present. AFI normal at 15.3 cm. Continue recommended antenatal testing and prenatal care including daily kick counts. Reviewed red flag symptoms and when to call. RTC x 1 weeks for NST and ROB.   ULTRASOUND REPORT  Location: [facility] Date of Service: 11/02/16  Indications:Growth and AFI  Findings:  Lauren Johnson intrauterine pregnancy is visualized with FHR at 153 BPM. Biometrics give an (U/S) Gestational age of 85 3/7 weeks and an (U/S) EDD of 12/03/16; this correlates with the clinically established EDD of 11/27/16.  Fetal presentation is Vertex.  EFW: 2707g (5lb 15oz) Williams 34 percentile. Placenta: Anterior, grade 2, remote to cervix. AFI: 15.3  Impression: 1. 36 3/7 week Viable Singleton Intrauterine pregnancy by U/S. 2. (U/S) EDD is consistent with Clinically established (LMP) EDD of 11/27/16. 3. Adequate Growth and AFI   Diona Fanti, CNM

## 2016-11-02 NOTE — Addendum Note (Signed)
Addended by: Earl Lagos on: 11/02/2016 04:32 PM   Modules accepted: Orders

## 2016-11-05 LAB — GC/CHLAMYDIA PROBE AMP
CHLAMYDIA, DNA PROBE: NEGATIVE
Neisseria gonorrhoeae by PCR: NEGATIVE

## 2016-11-05 LAB — STREP GP B NAA: Strep Gp B NAA: NEGATIVE

## 2016-11-10 ENCOUNTER — Other Ambulatory Visit: Payer: Self-pay | Admitting: Certified Nurse Midwife

## 2016-11-10 ENCOUNTER — Encounter: Payer: Self-pay | Admitting: Certified Nurse Midwife

## 2016-11-10 ENCOUNTER — Ambulatory Visit (INDEPENDENT_AMBULATORY_CARE_PROVIDER_SITE_OTHER): Payer: Medicaid Other | Admitting: Certified Nurse Midwife

## 2016-11-10 VITALS — BP 115/82 | HR 87 | Wt 229.4 lb

## 2016-11-10 DIAGNOSIS — Z3493 Encounter for supervision of normal pregnancy, unspecified, third trimester: Secondary | ICD-10-CM | POA: Diagnosis not present

## 2016-11-10 LAB — URINE CULTURE, OB REFLEX

## 2016-11-10 LAB — POCT URINALYSIS DIPSTICK
Bilirubin, UA: NEGATIVE
GLUCOSE UA: NEGATIVE
KETONES UA: NEGATIVE
LEUKOCYTES UA: NEGATIVE
Nitrite, UA: POSITIVE
PROTEIN UA: NEGATIVE
SPEC GRAV UA: 1.01 (ref 1.030–1.035)
Urobilinogen, UA: NEGATIVE (ref ?–2.0)
pH, UA: 6.5 (ref 5.0–8.0)

## 2016-11-10 LAB — CULTURE, OB URINE

## 2016-11-10 MED ORDER — NITROFURANTOIN MONOHYD MACRO 100 MG PO CAPS: 100.0000 mg | ORAL_CAPSULE | Freq: Two times a day (BID) | ORAL | 0 refills | Status: DC

## 2016-11-10 MED ORDER — ZOLPIDEM TARTRATE 5 MG PO TABS: 5.0000 mg | ORAL_TABLET | Freq: Every evening | ORAL | 0 refills | Status: DC | PRN

## 2016-11-10 NOTE — Progress Notes (Signed)
ROB at 37.4, no complaints. Denies LOF, Vag Bleeding, and contractions. Admits to mucus discharge with no odor , itching or burning. Discussed mucus plug. NST today: reactive, baseline 135, moderate variability, no decelerations, accelerations present. No contractions.   Reviewed Labor precautions. ROB/NST in one week.   Philip Aspen, CNM

## 2016-11-10 NOTE — Patient Instructions (Addendum)
How a Baby Grows During Pregnancy Pregnancy begins when a female's sperm enters a female's egg (fertilization). This happens in one of the tubes (fallopian tubes) that connect the ovaries to the womb (uterus). The fertilized egg is called an embryo until it reaches 10 weeks. From 10 weeks until birth, it is called a fetus. The fertilized egg moves down the fallopian tube to the uterus. Then it implants into the lining of the uterus and begins to grow. The developing fetus receives oxygen and nutrients through the pregnant woman's bloodstream and the tissues that grow (placenta) to support the fetus. The placenta is the life support system for the fetus. It provides nutrition and removes waste. Learning as much as you can about your pregnancy and how your baby is developing can help you enjoy the experience. It can also make you aware of when there might be a problem and when to ask questions. How long does a typical pregnancy last? A pregnancy usually lasts 280 days, or about 40 weeks. Pregnancy is divided into three trimesters:  First trimester: 0-13 weeks.  Second trimester: 14-27 weeks.  Third trimester: 28-40 weeks. The day when your baby is considered ready to be born (full term) is your estimated date of delivery. How does my baby develop month by month? First month  The fertilized egg attaches to the inside of the uterus.  Some cells will form the placenta. Others will form the fetus.  The arms, legs, brain, spinal cord, lungs, and heart begin to develop.  At the end of the first month, the heart begins to beat. Second month  The bones, inner ear, eyelids, hands, and feet form.  The genitals develop.  By the end of 8 weeks, all major organs are developing. Third month  All of the internal organs are forming.  Teeth develop below the gums.  Bones and muscles begin to grow. The spine can flex.  The skin is transparent.  Fingernails and toenails begin to form.  Arms and  legs continue to grow longer, and hands and feet develop.  The fetus is about 3 in (7.6 cm) long. Fourth month  The placenta is completely formed.  The external sex organs, neck, outer ear, eyebrows, eyelids, and fingernails are formed.  The fetus can hear, swallow, and move its arms and legs.  The kidneys begin to produce urine.  The skin is covered with a white waxy coating (vernix) and very fine hair (lanugo). Fifth month  The fetus moves around more and can be felt for the first time (quickening).  The fetus starts to sleep and wake up and may begin to suck its finger.  The nails grow to the end of the fingers.  The organ in the digestive system that makes bile (gallbladder) functions and helps to digest the nutrients.  If your baby is a girl, eggs are present in her ovaries. If your baby is a boy, testicles start to move down into his scrotum. Sixth month  The lungs are formed, but the fetus is not yet able to breathe.  The eyes open. The brain continues to develop.  Your baby has fingerprints and toe prints. Your baby's hair grows thicker.  At the end of the second trimester, the fetus is about 9 in (22.9 cm) long. Seventh month  The fetus kicks and stretches.  The eyes are developed enough to sense changes in light.  The hands can make a grasping motion.  The fetus responds to sound. Eighth month    All organs and body systems are fully developed and functioning.  Bones harden and taste buds develop. The fetus may hiccup.  Certain areas of the brain are still developing. The skull remains soft. Ninth month  The fetus gains about  lb (0.23 kg) each week.  The lungs are fully developed.  Patterns of sleep develop.  The fetus's head typically moves into a head-down position (vertex) in the uterus to prepare for birth. If the buttocks move into a vertex position instead, the baby is breech.  The fetus weighs 6-9 lbs (2.72-4.08 kg) and is 19-20 in  (48.26-50.8 cm) long. What can I do to have a healthy pregnancy and help my baby develop?  Eating and Drinking  Eat a healthy diet.  Talk with your health care provider to make sure that you are getting the nutrients that you and your baby need.  Visit www.choosemyplate.gov to learn about creating a healthy diet.  Gain a healthy amount of weight during pregnancy as advised by your health care provider. This is usually 25-35 pounds. You may need to:  Gain more if you were underweight before getting pregnant or if you are pregnant with more than one baby.  Gain less if you were overweight or obese when you got pregnant. Medicines and Vitamins  Take prenatal vitamins as directed by your health care provider. These include vitamins such as folic acid, iron, calcium, and vitamin D. They are important for healthy development.  Take medicines only as directed by your health care provider. Read labels and ask a pharmacist or your health care provider whether over-the-counter medicines, supplements, and prescription drugs are safe to take during pregnancy. Activities  Be physically active as advised by your health care provider. Ask your health care provider to recommend activities that are safe for you to do, such as walking or swimming.  Do not participate in strenuous or extreme sports. Lifestyle  Do not drink alcohol.  Do not use any tobacco products, including cigarettes, chewing tobacco, or electronic cigarettes. If you need help quitting, ask your health care provider.  Do not use illegal drugs. Safety  Avoid exposure to mercury, lead, or other heavy metals. Ask your health care provider about common sources of these heavy metals.  Avoid listeria infection during pregnancy. Follow these precautions:  Do not eat soft cheeses or deli meats.  Do not eat hot dogs unless they have been warmed up to the point of steaming, such as in the microwave oven.  Do not drink unpasteurized  milk.  Avoid toxoplasmosis infection during pregnancy. Follow these precautions:  Do not change your cat's litter box, if you have a cat. Ask someone else to do this for you.  Wear gardening gloves while working in the yard. General Instructions  Keep all follow-up visits as directed by your health care provider. This is important. This includes prenatal care and screening tests.  Manage any chronic health conditions. Work closely with your health care provider to keep conditions, such as diabetes, under control. How do I know if my baby is developing well? At each prenatal visit, your health care provider will do several different tests to check on your health and keep track of your baby's development. These include:  Fundal height.  Your health care provider will measure your growing belly from top to bottom using a tape measure.  Your health care provider will also feel your belly to determine your baby's position.  Heartbeat.  An ultrasound in the first trimester can   confirm pregnancy and show a heartbeat, depending on how far along you are.  Your health care provider will check your baby's heart rate at every prenatal visit.  As you get closer to your delivery date, you may have regular fetal heart rate monitoring to make sure that your baby is not in distress.  Second trimester ultrasound.  This ultrasound checks your baby's development. It also indicates your baby's gender. What should I do if I have concerns about my baby's development? Always talk with your health care provider about any concerns that you may have. This information is not intended to replace advice given to you by your health care provider. Make sure you discuss any questions you have with your health care provider. Document Released: 01/10/2008 Document Revised: 12/30/2015 Document Reviewed: 12/31/2013 Elsevier Interactive Patient Education  2017 [employer]  [employer]  of the uterus can occur throughout pregnancy, but they are not always a sign that you are in labor. You may have practice contractions called Braxton Hicks contractions. These false labor contractions are sometimes confused with true labor. What are [place] contractions? Braxton Hicks contractions are tightening movements that occur in the muscles of the uterus before labor. Unlike true labor contractions, these contractions do not result in opening (dilation) and thinning of the cervix. Toward the end of pregnancy (32-34 weeks), Braxton Hicks contractions can happen more often and may become stronger. These contractions are sometimes difficult to tell apart from true labor because they can be very uncomfortable. You should not feel embarrassed if you go to the hospital with false labor. Sometimes, the only way to tell if you are in true labor is for your health care provider to look for changes in the cervix. The health care provider will do a physical exam and may monitor your contractions. If you are not in true labor, the exam should show that your cervix is not dilating and your water has not broken. If there are no prenatal problems or other health problems associated with your pregnancy, it is completely safe for you to be sent home with false labor. You may continue to have Braxton Hicks contractions until you go into true labor. How can I tell the difference between true labor and false labor?  Differences  False labor  Contractions last 30-70 seconds.: Contractions are usually shorter and not as strong as true labor contractions.  Contractions become very regular.: Contractions are usually irregular.  Discomfort is usually felt in the top of the uterus, and it spreads to the lower abdomen and low back.: Contractions are often felt in the front of the lower abdomen and in the groin.  Contractions do not go away with walking.: Contractions may go away when you walk around or change  positions while lying down.  Contractions usually become more intense and increase in frequency.: Contractions get weaker and are shorter-lasting as time goes on.  The cervix dilates and gets thinner.: The cervix usually does not dilate or become thin. Follow these instructions at home:  Take over-the-counter and prescription medicines only as told by your health care provider.  Keep up with your usual exercises and follow other instructions from your health care provider.  Eat and drink lightly if you think you are going into labor.  If Braxton Hicks contractions are making you uncomfortable:  Change your position from lying down or resting to walking, or change from walking to resting.  Sit and rest in a tub of warm water.  Drink enough fluid to keep your urine clear or pale yellow. Dehydration may cause these contractions.  Do slow and deep breathing several times an hour.  Keep all follow-up prenatal visits as told by your health care provider. This is important. Contact a health care provider if:  You have a fever.  You have continuous pain in your abdomen. Get help right away if:  Your contractions become stronger, more regular, and closer together.  You have fluid leaking or gushing from your vagina.  You pass blood-tinged mucus (bloody show).  You have bleeding from your vagina.  You have low back pain that you never had before.  You feel your baby's head pushing down and causing pelvic pressure.  Your baby is not moving inside you as much as it used to. Summary  Contractions that occur before labor are called Braxton Hicks contractions, false labor, or practice contractions.  Braxton Hicks contractions are usually shorter, weaker, farther apart, and less regular than true labor contractions. True labor contractions usually become progressively stronger and regular and they become more frequent.  Manage discomfort from [facility] contractions by changing  position, resting in a warm bath, drinking plenty of water, or practicing deep breathing. This information is not intended to replace advice given to you by your health care provider. Make sure you discuss any questions you have with your health care provider. Document Released: 07/24/2005 Document Revised: 06/12/2016 Document Reviewed: 06/12/2016 Elsevier Interactive Patient Education  2017 [employer].

## 2016-11-10 NOTE — Addendum Note (Signed)
Addended by: Raliegh Ip on: 11/10/2016 12:07 PM   Modules accepted: Orders

## 2016-11-14 ENCOUNTER — Other Ambulatory Visit: Payer: Medicaid Other

## 2016-11-15 ENCOUNTER — Other Ambulatory Visit: Payer: Self-pay | Admitting: Certified Nurse Midwife

## 2016-11-15 LAB — URINE CULTURE

## 2016-11-17 ENCOUNTER — Other Ambulatory Visit: Payer: Self-pay | Admitting: Certified Nurse Midwife

## 2016-11-17 ENCOUNTER — Encounter: Payer: Self-pay | Admitting: Certified Nurse Midwife

## 2016-11-17 ENCOUNTER — Ambulatory Visit (INDEPENDENT_AMBULATORY_CARE_PROVIDER_SITE_OTHER): Payer: Medicaid Other | Admitting: Certified Nurse Midwife

## 2016-11-17 VITALS — BP 155/84 | HR 91 | Wt 231.2 lb

## 2016-11-17 DIAGNOSIS — Z3493 Encounter for supervision of normal pregnancy, unspecified, third trimester: Secondary | ICD-10-CM

## 2016-11-17 DIAGNOSIS — R772 Abnormality of alphafetoprotein: Secondary | ICD-10-CM

## 2016-11-17 DIAGNOSIS — O99323 Drug use complicating pregnancy, third trimester: Secondary | ICD-10-CM

## 2016-11-17 LAB — POCT URINALYSIS DIPSTICK
BILIRUBIN UA: NEGATIVE
GLUCOSE UA: NEGATIVE
Ketones, UA: NEGATIVE
NITRITE UA: POSITIVE
PH UA: 7 (ref 5.0–8.0)
Protein, UA: 1
Spec Grav, UA: 1.01 (ref 1.010–1.025)
Urobilinogen, UA: NEGATIVE E.U./dL — AB

## 2016-11-17 MED ORDER — CIPROFLOXACIN HCL 250 MG PO TABS: 250.0000 mg | ORAL_TABLET | Freq: Two times a day (BID) | ORAL | 0 refills | Status: DC

## 2016-11-17 MED ORDER — ZOLPIDEM TARTRATE 5 MG PO TABS: 5.0000 mg | ORAL_TABLET | Freq: Every evening | ORAL | 0 refills | Status: DC | PRN

## 2016-11-17 MED ORDER — CITRANATAL ASSURE 35-1 & 300 MG PO MISC: 1.0000 | Freq: Every day | ORAL | 11 refills | Status: DC

## 2016-11-17 NOTE — Patient Instructions (Signed)
Fetal Movement Counts Patient Name: ________________________________________________ Patient Due Date: ____________________ What is a fetal movement count? A fetal movement count is the number of times that you feel your baby move during a certain amount of time. This may also be called a fetal kick count. A fetal movement count is recommended for every pregnant woman. You may be asked to start counting fetal movements as early as week 28 of your pregnancy. Pay attention to when your baby is most active. You may notice your baby's sleep and wake cycles. You may also notice things that make your baby move more. You should do a fetal movement count: When your baby is normally most active. At the same time each day. A good time to count movements is while you are resting, after having something to eat and drink. How do I count fetal movements? Find a quiet, comfortable area. Sit, or lie down on your side. Write down the date, the start time and stop time, and the number of movements that you felt between those two times. Take this information with you to your health care visits. For 2 hours, count kicks, flutters, swishes, rolls, and jabs. You should feel at least 10 movements during 2 hours. You may stop counting after you have felt 10 movements. If you do not feel 10 movements in 2 hours, have something to eat and drink. Then, keep resting and counting for 1 hour. If you feel at least 4 movements during that hour, you may stop counting. Contact a health care provider if: You feel fewer than 4 movements in 2 hours. Your baby is not moving like he or she usually does. Date: ____________ Start time: ____________ Stop time: ____________ Movements: ____________ Date: ____________ Start time: ____________ Stop time: ____________ Movements: ____________ Date: ____________ Start time: ____________ Stop time: ____________ Movements: ____________ Date: ____________ Start time: ____________ Stop time:  ____________ Movements: ____________ Date: ____________ Start time: ____________ Stop time: ____________ Movements: ____________ Date: ____________ Start time: ____________ Stop time: ____________ Movements: ____________ Date: ____________ Start time: ____________ Stop time: ____________ Movements: ____________ Date: ____________ Start time: ____________ Stop time: ____________ Movements: ____________ Date: ____________ Start time: ____________ Stop time: ____________ Movements: ____________ This information is not intended to replace advice given to you by your health care provider. Make sure you discuss any questions you have with your health care provider. Document Released: 08/23/2006 Document Revised: 03/22/2016 Document Reviewed: 09/02/2015 Elsevier Interactive Patient Education  2017 [employer]. Hypertension During Pregnancy Hypertension is also called high blood pressure. High blood pressure means that the force of your blood moving in your body is too strong. When you are pregnant, this condition should be watched carefully. It can cause problems for you and your baby. Follow these instructions at home: Eating and drinking   Drink enough fluid to keep your pee (urine) clear or pale yellow.  Eat healthy foods that are low in salt (sodium).  Do not add salt to your food.  Check labels on foods and drinks to see much salt is in them. Look on the label where you see "Sodium." Lifestyle   Do not use any products that contain nicotine or tobacco, such as cigarettes and e-cigarettes. If you need help quitting, ask your doctor.  Do not use alcohol.  Avoid caffeine.  Avoid stress. Rest and get plenty of sleep. General instructions   Take over-the-counter and prescription medicines only as told by your doctor.  While lying down, lie on your left side. This keeps pressure off  your baby.  While sitting or lying down, raise (elevate) your feet. Try putting some pillows under your  lower legs.  Exercise regularly. Ask your doctor what kinds of exercise are best for you.  Keep all prenatal and follow-up visits as told by your doctor. This is important. Contact a doctor if:  You have symptoms that your doctor told you to watch for, such as:  Fever.  Throwing up (vomiting).  Headache. Get help right away if:  You have very bad pain in your belly (abdomen).  You are throwing up, and this does not get better with treatment.  You suddenly get swelling in your hands, ankles, or face.  You gain 4 lb (1.8 kg) or more in 1 week.  You get bleeding from your vagina.  You have blood in your pee.  You do not feel your baby moving as much as normal.  You have a change in vision.  You have muscle twitching or sudden tightening (spasms).  You have trouble breathing.  Your lips or fingernails turn blue. This information is not intended to replace advice given to you by your health care provider. Make sure you discuss any questions you have with your health care provider. Document Released: 08/26/2010 Document Revised: 04/04/2016 Document Reviewed: 04/04/2016 Elsevier Interactive Patient Education  2017 [employer].

## 2016-11-17 NOTE — Progress Notes (Signed)
Urine culture positive. Cipro 250mg  po ordered.   Philip Aspen, CNM

## 2016-11-17 NOTE — Progress Notes (Signed)
ROB and nst. Per AT rx cipro for uti erx.    NONSTRESS TEST INTERPRETATION  INDICATIONS: increase DRS  FHR baseline: 140   RESULTS:Reactive COMMENTS:    PLAN: 1. Continue fetal kick counts twice a day. 2. Continue antepartum testing as scheduled-weekly   Joyice Faster, CMA

## 2016-11-19 NOTE — Progress Notes (Addendum)
ROB- NST reviewed and found to be reactive. Pt reports headaches relieved with Tylenol and caffeine. Repeat blood pressures while on NST 140/79, 141/75. Denies blurred vision or visual changes and epigastric pain. Reviewed red flag symptoms and when to call. Continue antepartum testing as recommended. RTC x 1 week for NST/ROB

## 2016-11-22 ENCOUNTER — Ambulatory Visit (INDEPENDENT_AMBULATORY_CARE_PROVIDER_SITE_OTHER): Payer: Medicaid Other | Admitting: Certified Nurse Midwife

## 2016-11-22 ENCOUNTER — Other Ambulatory Visit: Payer: Medicaid Other

## 2016-11-22 ENCOUNTER — Inpatient Hospital Stay
Admission: EM | Admit: 2016-11-22 | Discharge: 2016-11-27 | DRG: 766 | Disposition: A | Discharge status: Home / Self Care | Payer: Medicaid Other | Attending: Obstetrics and Gynecology | Admitting: Obstetrics and Gynecology

## 2016-11-22 VITALS — BP 154/87 | HR 93 | Wt 230.8 lb

## 2016-11-22 DIAGNOSIS — Z3493 Encounter for supervision of normal pregnancy, unspecified, third trimester: Secondary | ICD-10-CM

## 2016-11-22 DIAGNOSIS — F17200 Nicotine dependence, unspecified, uncomplicated: Secondary | ICD-10-CM | POA: Diagnosis present

## 2016-11-22 DIAGNOSIS — O133 Gestational [pregnancy-induced] hypertension without significant proteinuria, third trimester: Secondary | ICD-10-CM | POA: Diagnosis not present

## 2016-11-22 DIAGNOSIS — Z3A39 39 weeks gestation of pregnancy: Secondary | ICD-10-CM

## 2016-11-22 DIAGNOSIS — Z3490 Encounter for supervision of normal pregnancy, unspecified, unspecified trimester: Secondary | ICD-10-CM | POA: Diagnosis present

## 2016-11-22 DIAGNOSIS — Z6790 Unspecified blood type, Rh positive: Secondary | ICD-10-CM

## 2016-11-22 DIAGNOSIS — O1404 Mild to moderate pre-eclampsia, complicating childbirth: Principal | ICD-10-CM | POA: Diagnosis present

## 2016-11-22 DIAGNOSIS — O1494 Unspecified pre-eclampsia, complicating childbirth: Secondary | ICD-10-CM | POA: Diagnosis not present

## 2016-11-22 DIAGNOSIS — O99334 Smoking (tobacco) complicating childbirth: Secondary | ICD-10-CM | POA: Diagnosis not present

## 2016-11-22 DIAGNOSIS — Z98891 History of uterine scar from previous surgery: Secondary | ICD-10-CM

## 2016-11-22 LAB — COMPREHENSIVE METABOLIC PANEL
ALT: 11 U/L — ABNORMAL LOW (ref 14–54)
AST: 24 U/L (ref 15–41)
Albumin: 2.9 g/dL — ABNORMAL LOW (ref 3.5–5.0)
Alkaline Phosphatase: 100 U/L (ref 38–126)
Anion gap: 8 (ref 5–15)
BUN: 7 mg/dL (ref 6–20)
CHLORIDE: 106 mmol/L (ref 101–111)
CO2: 24 mmol/L (ref 22–32)
Calcium: 8.9 mg/dL (ref 8.9–10.3)
Creatinine, Ser: 0.54 mg/dL (ref 0.44–1.00)
Glucose, Bld: 112 mg/dL — ABNORMAL HIGH (ref 65–99)
POTASSIUM: 3.4 mmol/L — AB (ref 3.5–5.1)
Sodium: 138 mmol/L (ref 135–145)
Total Bilirubin: 0.4 mg/dL (ref 0.3–1.2)
Total Protein: 6.8 g/dL (ref 6.5–8.1)

## 2016-11-22 LAB — URIC ACID: Uric Acid, Serum: 3.8 mg/dL (ref 2.3–6.6)

## 2016-11-22 LAB — POCT URINALYSIS DIPSTICK
Bilirubin, UA: NEGATIVE
Glucose, UA: NEGATIVE
Ketones, UA: NEGATIVE
NITRITE UA: NEGATIVE
PH UA: 7 (ref 5.0–8.0)
Spec Grav, UA: 1.01 (ref 1.010–1.025)
Urobilinogen, UA: 0.2 E.U./dL

## 2016-11-22 LAB — TYPE AND SCREEN
ABO/RH(D): O POS
ANTIBODY SCREEN: NEGATIVE

## 2016-11-22 LAB — CBC
HCT: 37.5 % (ref 35.0–47.0)
Hemoglobin: 13.1 g/dL (ref 12.0–16.0)
MCH: 30.7 pg (ref 26.0–34.0)
MCHC: 34.9 g/dL (ref 32.0–36.0)
MCV: 87.9 fL (ref 80.0–100.0)
PLATELETS: 371 10*3/uL (ref 150–440)
RBC: 4.27 MIL/uL (ref 3.80–5.20)
RDW: 15 % — AB (ref 11.5–14.5)
WBC: 13.7 10*3/uL — ABNORMAL HIGH (ref 3.6–11.0)

## 2016-11-22 LAB — PROTEIN / CREATININE RATIO, URINE
CREATININE, URINE: 166 mg/dL
Protein Creatinine Ratio: 0.57 mg/mg{Cre} — ABNORMAL HIGH (ref 0.00–0.15)
TOTAL PROTEIN, URINE: 94 mg/dL

## 2016-11-22 MED ORDER — ZOLPIDEM TARTRATE 5 MG PO TABS
ORAL_TABLET | ORAL | Status: AC
  Administered 2016-11-23: 5 mg via ORAL
  Filled 2016-11-22: qty 1

## 2016-11-22 MED ORDER — OXYTOCIN 10 UNIT/ML IJ SOLN
INTRAMUSCULAR | Status: AC
  Filled 2016-11-22: qty 2

## 2016-11-22 MED ORDER — MISOPROSTOL 200 MCG PO TABS
ORAL_TABLET | ORAL | Status: AC
  Filled 2016-11-22: qty 4

## 2016-11-22 MED ORDER — MISOPROSTOL 25 MCG QUARTER TABLET
ORAL_TABLET | ORAL | Status: AC
  Filled 2016-11-22: qty 1

## 2016-11-22 MED ORDER — OXYTOCIN 40 UNITS IN LACTATED RINGERS INFUSION - SIMPLE MED: 2.5000 [IU]/h | INTRAVENOUS | Status: DC

## 2016-11-22 MED ORDER — LACTATED RINGERS IV SOLN: 500.0000 mL | INTRAVENOUS | Status: DC | PRN

## 2016-11-22 MED ORDER — LACTATED RINGERS IV SOLN
INTRAVENOUS | Status: DC
  Administered 2016-11-22 – 2016-11-25 (×6): via INTRAVENOUS

## 2016-11-22 MED ORDER — CIPROFLOXACIN HCL 250 MG PO TABS
250.0000 mg | ORAL_TABLET | Freq: Two times a day (BID) | ORAL | Status: DC
  Administered 2016-11-22 – 2016-11-27 (×7): 250 mg via ORAL
  Filled 2016-11-22 (×13): qty 1

## 2016-11-22 MED ORDER — AMMONIA AROMATIC IN INHA
RESPIRATORY_TRACT | Status: AC
  Filled 2016-11-22: qty 10

## 2016-11-22 MED ORDER — FLEET ENEMA 7-19 GM/118ML RE ENEM: 1.0000 | ENEMA | RECTAL | Status: DC | PRN

## 2016-11-22 MED ORDER — ACETAMINOPHEN 325 MG PO TABS
650.0000 mg | ORAL_TABLET | ORAL | Status: DC | PRN
  Administered 2016-11-24 (×2): 650 mg via ORAL
  Filled 2016-11-22 (×2): qty 2

## 2016-11-22 MED ORDER — OXYTOCIN BOLUS FROM INFUSION: 500.0000 mL | Freq: Once | INTRAVENOUS | Status: DC

## 2016-11-22 MED ORDER — METOCLOPRAMIDE HCL 5 MG/ML IJ SOLN: 10.0000 mg | Freq: Four times a day (QID) | INTRAMUSCULAR | Status: DC | PRN

## 2016-11-22 MED ORDER — LIDOCAINE HCL (PF) 1 % IJ SOLN
INTRAMUSCULAR | Status: AC
  Filled 2016-11-22: qty 30

## 2016-11-22 MED ORDER — ONDANSETRON HCL 4 MG/2ML IJ SOLN: 4.0000 mg | Freq: Four times a day (QID) | INTRAMUSCULAR | Status: DC | PRN

## 2016-11-22 MED ORDER — MISOPROSTOL 25 MCG QUARTER TABLET
50.0000 ug | ORAL_TABLET | ORAL | Status: DC | PRN
  Administered 2016-11-22 – 2016-11-23 (×3): 50 ug via VAGINAL
  Filled 2016-11-22 (×2): qty 2

## 2016-11-22 MED ORDER — ZOLPIDEM TARTRATE 5 MG PO TABS
5.0000 mg | ORAL_TABLET | Freq: Every evening | ORAL | Status: DC | PRN
  Administered 2016-11-22 – 2016-11-26 (×4): 5 mg via ORAL
  Filled 2016-11-22 (×3): qty 1

## 2016-11-22 MED ORDER — TERBUTALINE SULFATE 1 MG/ML IJ SOLN: 0.2500 mg | Freq: Once | INTRAMUSCULAR | Status: DC | PRN

## 2016-11-22 MED ORDER — MISOPROSTOL 25 MCG QUARTER TABLET
25.0000 ug | ORAL_TABLET | ORAL | Status: DC | PRN
  Filled 2016-11-22: qty 1

## 2016-11-22 MED ORDER — LIDOCAINE HCL (PF) 1 % IJ SOLN: 30.0000 mL | INTRAMUSCULAR | Status: DC | PRN

## 2016-11-22 MED ORDER — SOD CITRATE-CITRIC ACID 500-334 MG/5ML PO SOLN
30.0000 mL | ORAL | Status: DC | PRN
  Administered 2016-11-25: 30 mL via ORAL
  Filled 2016-11-22: qty 15

## 2016-11-22 NOTE — Progress Notes (Signed)
ROB&NST- pt is having some headaches that tylenol isnt helping, has a spot on the back of her head that is hurting

## 2016-11-22 NOTE — H&P (Signed)
Obstetric History and Physical  Lauren Johnson is a 31 y.o. G1P0000 with IUP at [redacted]w[redacted]d, dated by LMP: 02/21/2016, presenting for induction of labor due to preeclampsia. She was seen in the office today for return antepartum appointment and NST due to elevated AFP.   She blood pressures 140-150/80-90 with headache unrelieved by tylenol, "floater" before eyes, and 3+ protein on urine dip. Dr. Enzo Bi consulted and patient seen to unit for induction of labor.     Prenatal Course  Source of Care: [facility] transfer at 23 weeks, 9 total visits  Pregnancy complications or risks: elevated AFP, increased down syndrome risk  Prenatal labs and studies:  ABO, Rh: --/--/O POS (04/18 1956)  Antibody: NEG (04/18 1956)  Rubella: Immune (11/04 0000)  RPR: Nonreactive (11/04 0000)   HBsAg: Negative (05/16/2016)  Varicella: Immune (09/05/2016)  HIV: Non-reactive (11/04 0000)   WJX:BJYNWGNF (03/29 1643)  1 hr Glucola  121 (09/05/2016)  Genetic screening abnormal with elevated AFP, increased down syndrom risk  Anatomy US normal  Past Medical History:  Diagnosis Date  . Cervical cancer [facility])     Past Surgical History:  Procedure Laterality Date  . CERVICAL BIOPSY  W/ LOOP ELECTRODE EXCISION    . FRACTURE SURGERY      OB History  Gravida Para Term Preterm AB Living  1 0 0 0 0 0  SAB TAB Ectopic Multiple Live Births  0 0 0 0      # Outcome Date GA Lbr Len/2nd Weight Sex Delivery Anes PTL Lv  1 Current               Social History   Social History  . Marital status: Single    Spouse name: N/A  . Number of children: N/A  . Years of education: N/A   Social History Main Topics  . Smoking status: Current Every Day Smoker    Packs/day: 0.50  . Smokeless tobacco: Never Used  . Alcohol use No     Comment: rare  . Drug use: No     Comment: "its been a few months" since last use  . Sexual activity: Yes   Other Topics Concern  . None   Social History Narrative  . None     History reviewed. No pertinent family history.  Prescriptions Prior to Admission  Medication Sig Dispense Refill Last Dose  . ciprofloxacin (CIPRO) 250 MG tablet Take 1 tablet (250 mg total) by mouth 2 (two) times daily. 14 tablet 0 Taking  . [DISCONTINUED] acetaminophen (TYLENOL) 500 MG tablet Take by mouth.   Taking  . [DISCONTINUED] folic acid (FOLVITE) 1 MG tablet Take 1 mg by mouth daily.   Taking  . [DISCONTINUED] Prenat w/o A-[id number]-[id number]-DHA (CITRANATAL ASSURE) 35-1 & 300 MG tablet Take 1 tablet by mouth daily. 30 tablet 11 Taking  . [DISCONTINUED] zolpidem (AMBIEN) 5 MG tablet Take 1 tablet (5 mg total) by mouth at bedtime as needed for sleep. 30 tablet 0 Taking    Allergies  Allergen Reactions  . Codeine Hives  . Latex Other (See Comments)    Throat swells    Review of Systems: Negative except for what is mentioned in HPI.  Physical Exam:  BP (!) 152/95   Pulse 83   Temp 98.3 F (36.8 C) (Oral)   Resp 20   Ht 5\' 7"  (1.702 m)   Wt 230 lb (104.3 kg)   LMP 02/21/2016   BMI 36.02 kg/m    GENERAL: Well-developed, well-nourished female in  no acute distress.   LUNGS: Clear to auscultation bilaterally.   HEART: Regular rate and rhythm.  ABDOMEN: Soft, nontender, nondistended, gravid.  EXTREMITIES: Nontender, 1+pitting edema, 2+ distal pulses. Reflexes WNL.   Cervical Exam: Deferred.  FHT:  Baseline rate 145 bpm   Variability moderate  Accelerations present   Decelerations none  Contractions: none   Pertinent Labs/Studies:   Results for orders placed or performed during the hospital encounter of 11/22/16 (from the past 24 hour(s))  Protein / creatinine ratio, urine     Status: Abnormal   Collection Time: 11/22/16  7:33 PM  Result Value Ref Range   Creatinine, Urine 166 mg/dL   Total Protein, Urine 94 mg/dL   Protein Creatinine Ratio 0.57 (H) 0.00 - 0.15 mg/mg[Cre]  CBC     Status: Abnormal   Collection Time: 11/22/16  7:56 PM  Result Value Ref Range    WBC 13.7 (H) 3.6 - 11.0 K/uL   RBC 4.27 3.80 - 5.20 MIL/uL   Hemoglobin 13.1 12.0 - 16.0 g/dL   HCT 37.5 35.0 - 47.0 %   MCV 87.9 80.0 - 100.0 fL   MCH 30.7 26.0 - 34.0 pg   MCHC 34.9 32.0 - 36.0 g/dL   RDW 15.0 (H) 11.5 - 14.5 %   Platelets 371 150 - 440 K/uL  Comprehensive metabolic panel     Status: Abnormal   Collection Time: 11/22/16  7:56 PM  Result Value Ref Range   Sodium 138 135 - 145 mmol/L   Potassium 3.4 (L) 3.5 - 5.1 mmol/L   Chloride 106 101 - 111 mmol/L   CO2 24 22 - 32 mmol/L   Glucose, Bld 112 (H) 65 - 99 mg/dL   BUN 7 6 - 20 mg/dL   Creatinine, Ser 0.54 0.44 - 1.00 mg/dL   Calcium 8.9 8.9 - 10.3 mg/dL   Total Protein 6.8 6.5 - 8.1 g/dL   Albumin 2.9 (L) 3.5 - 5.0 g/dL   AST 24 15 - 41 U/L   ALT 11 (L) 14 - 54 U/L   Alkaline Phosphatase 100 38 - 126 U/L   Total Bilirubin 0.4 0.3 - 1.2 mg/dL   GFR calc non Af Amer >60 >60 mL/min   GFR calc Af Amer >60 >60 mL/min   Anion gap 8 5 - 15  Type and screen     Status: None   Collection Time: 11/22/16  7:56 PM  Result Value Ref Range   ABO/RH(D) O POS    Antibody Screen NEG    Sample Expiration 11/25/2016   Uric acid     Status: None   Collection Time: 11/22/16  7:56 PM  Result Value Ref Range   Uric Acid, Serum 3.8 2.3 - 6.6 mg/dL    Assessment :  Induction of labor  Preeclampsia  Abnormal genetic screening  Rh positive  GBS negative  Plan epidural for pain management  FHR Category I   Plan:  Verbal consent obtained for induction of labor   Orders for admission and induction of labor  Plan of care reviewed with Dr. Enzo Bi  Reassess as needed  Diona Fanti, CNM [facility], [facility]

## 2016-11-22 NOTE — OB Triage Note (Signed)
Pt sent from office for IOL for hypertension

## 2016-11-22 NOTE — Progress Notes (Signed)
NST/ROB-NST performed today was reviewed and was found to be reactive.  Baseline 140 with moderate variability, accelerations present and no decelerations noted. Pt reports headache to back of head that is unrelieved by Tylenol, caffeine, or rest as well as floaters and spots before her eyes. Discuss POC with Dr. Enzo Bi, pt to L&D for induction of labor. POC reviewed with pt, verbalized understanding. Work note via Pharmacist, community. Reviewed red flag symptoms. Home to pack bag and the to [facility]. [place] report to [place], Therapist, sports.

## 2016-11-23 DIAGNOSIS — Z3A39 39 weeks gestation of pregnancy: Secondary | ICD-10-CM

## 2016-11-23 DIAGNOSIS — O99334 Smoking (tobacco) complicating childbirth: Secondary | ICD-10-CM

## 2016-11-23 DIAGNOSIS — O1494 Unspecified pre-eclampsia, complicating childbirth: Secondary | ICD-10-CM

## 2016-11-23 LAB — CBC WITH DIFFERENTIAL/PLATELET
BASOS ABS: 0.1 10*3/uL (ref 0–0.1)
Basophils Relative: 1 %
EOS PCT: 1 %
Eosinophils Absolute: 0.1 10*3/uL (ref 0–0.7)
HCT: 37.3 % (ref 35.0–47.0)
Hemoglobin: 13 g/dL (ref 12.0–16.0)
LYMPHS PCT: 19 %
Lymphs Abs: 2.7 10*3/uL (ref 1.0–3.6)
MCH: 30.5 pg (ref 26.0–34.0)
MCHC: 34.8 g/dL (ref 32.0–36.0)
MCV: 87.9 fL (ref 80.0–100.0)
Monocytes Absolute: 0.8 10*3/uL (ref 0.2–0.9)
Monocytes Relative: 6 %
NEUTROS ABS: 10.4 10*3/uL — AB (ref 1.4–6.5)
NEUTROS PCT: 73 %
Platelets: 315 10*3/uL (ref 150–440)
RBC: 4.24 MIL/uL (ref 3.80–5.20)
RDW: 15.1 % — ABNORMAL HIGH (ref 11.5–14.5)
WBC: 14.1 10*3/uL — AB (ref 3.6–11.0)

## 2016-11-23 LAB — COMPREHENSIVE METABOLIC PANEL
ALT: 12 U/L — ABNORMAL LOW (ref 14–54)
ANION GAP: 6 (ref 5–15)
AST: 18 U/L (ref 15–41)
Albumin: 3 g/dL — ABNORMAL LOW (ref 3.5–5.0)
Alkaline Phosphatase: 87 U/L (ref 38–126)
BILIRUBIN TOTAL: 0.4 mg/dL (ref 0.3–1.2)
BUN: 5 mg/dL — AB (ref 6–20)
CO2: 24 mmol/L (ref 22–32)
Calcium: 8.8 mg/dL — ABNORMAL LOW (ref 8.9–10.3)
Chloride: 105 mmol/L (ref 101–111)
Creatinine, Ser: 0.35 mg/dL — ABNORMAL LOW (ref 0.44–1.00)
Glucose, Bld: 76 mg/dL (ref 65–99)
Potassium: 3.6 mmol/L (ref 3.5–5.1)
Sodium: 135 mmol/L (ref 135–145)
TOTAL PROTEIN: 6.6 g/dL (ref 6.5–8.1)

## 2016-11-23 MED ORDER — TERBUTALINE SULFATE 1 MG/ML IJ SOLN: 0.2500 mg | Freq: Once | INTRAMUSCULAR | Status: DC | PRN

## 2016-11-23 MED ORDER — HYDRALAZINE HCL 20 MG/ML IJ SOLN: 10.0000 mg | Freq: Once | INTRAMUSCULAR | Status: DC | PRN

## 2016-11-23 MED ORDER — MAGNESIUM SULFATE 4 GM/100ML IV SOLN
INTRAVENOUS | Status: AC
  Administered 2016-11-23: 4 g
  Filled 2016-11-23: qty 100

## 2016-11-23 MED ORDER — OXYTOCIN 40 UNITS IN LACTATED RINGERS INFUSION - SIMPLE MED
INTRAVENOUS | Status: AC
  Filled 2016-11-23: qty 1000

## 2016-11-23 MED ORDER — LABETALOL HCL 5 MG/ML IV SOLN: 20.0000 mg | INTRAVENOUS | Status: DC | PRN

## 2016-11-23 MED ORDER — CALCIUM GLUCONATE 10 % IV SOLN: 1.0000 g | Freq: Once | INTRAVENOUS | Status: DC | PRN

## 2016-11-23 MED ORDER — CALCIUM GLUCONATE 10 % IV SOLN
INTRAVENOUS | Status: AC
  Filled 2016-11-23: qty 10

## 2016-11-23 MED ORDER — MAGNESIUM SULFATE 50 % IJ SOLN
2.0000 g/h | INTRAVENOUS | Status: DC
  Administered 2016-11-23 – 2016-11-24 (×2): 2 g/h via INTRAVENOUS
  Filled 2016-11-23 (×2): qty 80

## 2016-11-23 MED ORDER — OXYTOCIN 40 UNITS IN LACTATED RINGERS INFUSION - SIMPLE MED
1.0000 m[IU]/min | INTRAVENOUS | Status: DC
  Administered 2016-11-23: 2 m[IU]/min via INTRAVENOUS
  Filled 2016-11-23: qty 1000

## 2016-11-23 MED ORDER — MAGNESIUM SULFATE BOLUS VIA INFUSION
4.0000 g | Freq: Once | INTRAVENOUS | Status: AC
Start: 2016-11-23 — End: 2016-11-23
  Administered 2016-11-23: 4 g via INTRAVENOUS
  Filled 2016-11-23: qty 500

## 2016-11-23 NOTE — Progress Notes (Signed)
Spoke to CNM about magnesium bolus order. Ordered to finish infusion that is started. Verified order and medication with CNM.

## 2016-11-23 NOTE — Progress Notes (Signed)
LABOR NOTE   Lauren Johnson 31 y.o.GP@ at [redacted]w[redacted]d induction of labor for Pre Eclampsia  SUBJECTIVE:  She is having mild cramping. Denies visual changes, headache,  and epigastric pain.  She states that she does have swelling in her hands and face.  OBJECTIVE:  BP (!) 133/91   Pulse 71   Temp 98 F (36.7 C) (Oral)   Resp 16   Ht 5\' 7"  (1.702 m)   Wt 230 lb (104.3 kg)   LMP 02/21/2016   BMI 36.02 kg/m  Total I/O In: 1260.3 [I.V.:1260.3] Out: -   Edema : bilaterally  Legs 2+ Reflexes: 1-2+ bilaterally legs No clonus present.   CERVIX: not evaluated:  SVE:   Dilation: 3 Effacement (%): 80 Station: -3 Exam by:: Charlott Holler RN CONTRACTIONS: regular, every 2-4 minutes FHR: Fetal heart tracing reviewed. Baseline: 140 bpm, Variability: Good {> 6 bpm), Accelerations: Reactive and Decelerations: Absent Category I   Analgesia: Labor support without medications  Labs: Lab Results  Component Value Date   WBC 14.1 (H) 11/23/2016   HGB 13.0 11/23/2016   HCT 37.3 11/23/2016   MCV 87.9 11/23/2016   PLT 315 11/23/2016    ASSESSMENT: 1) Labor curve reviewed.       Progress: Not in labor.     Membranes: intact      Active Problems:   Encounter for induction of labor Pre Eclampsia  PLAN: Continue IV Pitocin induction Magnesium Sulfate Therapy   Philip Aspen, CNM 11/23/2016 6:27 PM

## 2016-11-23 NOTE — Progress Notes (Signed)
L&D IOL Note:  39.3 week intrauterine pregnancy with preeclampsia Ongoing Cytotec cervical ripening  Subjective: Contractions are noticeable by patient with moderate crampiness. No vaginal bleeding or leakage of fluid. Last Cytotec dosing was deferred due to the every 2 to three-minute contraction rate.  OBJECTIVE: BP (!) 150/82 (BP Location: Right Arm)   Pulse 67   Temp 98.1 F (36.7 C) (Oral)   Resp 18   Ht 5\' 5"  (1.651 m)   Wt 189 lb (85.7 kg)   LMP 02/27/2016   BMI 31.45 kg/m  Abdomen: Nontender; uterus nontender Pelvic:  Cervix: 2 cm/80%/-3/vertex/BOWI Fetal heart rate tracing: Category 1  ASSESSMENT: 1. 39.3 week intrauterine pregnancy with preeclampsia 2. Cervical ripening ongoing  PLAN: 1. Recheck cervix in 2 hours 2. If further progress is noted, we'll begin magnesium sulfate prophylaxis, start Pitocin, and consider amniotomy  Brayton Mars, MD

## 2016-11-23 NOTE — Progress Notes (Signed)
Progress Note: S: No PIH symptoms now. O: BP (!) 147/101 (BP Location: Right Arm)   Pulse (!) 58   Temp 98 F (36.7 C) (Oral)   Resp 16   Ht 5\' 7"  (1.702 m)   Wt 230 lb (104.3 kg)   LMP 02/21/2016   BMI 36.02 kg/m   No recurrent severe pressures today Last Cervix exam-RN 3/80/-3/vertex FHR-Cat 1 A: 39.3 weeek IUP with Pre Eclampsia, S/P Cytotec ripening, now on Pitocin P: Start Magnesium, Foley, Hourly I/O with DTR checks Brayton Mars, MD

## 2016-11-23 NOTE — Progress Notes (Signed)
Lauren Johnson is a 31 y.o. G1P0000 at [redacted]w[redacted]d by LMP: 02/21/2016, admitted for induction of labor due to preeclampsia.  Subjective:  Pt resting quietly in bed, watching television. Reports good fetal movement and occasional abdominal cramping.   Denies difficulty breathing or respiratory distress, chest pain, headache, abdominal pain, vaginal bleeding, leakage of fluid, or leg pain or swelling.   Objective:  BP (!) 151/91   Pulse 68   Temp 98.1 F (36.7 C) (Oral)   Resp 20   Ht 5\' 7"  (1.702 m)   Wt 230 lb (104.3 kg)   LMP 02/21/2016   BMI 36.02 kg/m  I/O last 3 completed shifts: In: 1281.7 [P.O.:240; I.V.:1041.7] Out: -   GENERAL: Alert and oriented x 4, no apparent distress  LUNGS: CTAB  HEART: RRR  ABDOMEN: soft, gravid, non-tender  REFLEXES: WNL  FHT:  FHR: 135 bpm, variability: moderate,  accelerations:  Present,  decelerations:  Absent   UC:   regular, every two (2) to four (4) minutes  SVE:   Dilation: Fingertip Effacement (%): Thick Station: Ballotable Exam by:: T.Roberts RN  Labs: Lab Results  Component Value Date   WBC 13.7 (H) 11/22/2016   HGB 13.1 11/22/2016   HCT 37.5 11/22/2016   MCV 87.9 11/22/2016   PLT 371 11/22/2016    Assessment:  G1P0 [redacted]w[redacted]d Induction of labor-preeclampsia, cervical ripening-cytotec, abnormal genetic screening-declined follow up, Rh positive,   FHR Category I  Plan:  Continue orders as written. Reassess as needed.   Report given to Dr. Enzo Bi.    Diona Fanti, CNM 11/23/2016, 7:15 AM

## 2016-11-24 ENCOUNTER — Inpatient Hospital Stay: Payer: Medicaid Other | Admitting: Anesthesiology

## 2016-11-24 ENCOUNTER — Encounter: Payer: Self-pay | Admitting: Anesthesiology

## 2016-11-24 LAB — CBC
HEMATOCRIT: 36.8 % (ref 35.0–47.0)
Hemoglobin: 12.9 g/dL (ref 12.0–16.0)
MCH: 30.4 pg (ref 26.0–34.0)
MCHC: 35 g/dL (ref 32.0–36.0)
MCV: 86.9 fL (ref 80.0–100.0)
Platelets: 323 10*3/uL (ref 150–440)
RBC: 4.24 MIL/uL (ref 3.80–5.20)
RDW: 15.6 % — AB (ref 11.5–14.5)
WBC: 13 10*3/uL — AB (ref 3.6–11.0)

## 2016-11-24 LAB — RPR: RPR Ser Ql: NONREACTIVE

## 2016-11-24 MED ORDER — NALBUPHINE HCL 10 MG/ML IJ SOLN: 5.0000 mg | INTRAMUSCULAR | Status: DC | PRN

## 2016-11-24 MED ORDER — NALOXONE HCL 0.4 MG/ML IJ SOLN: 0.4000 mg | INTRAMUSCULAR | Status: DC | PRN

## 2016-11-24 MED ORDER — LIDOCAINE-EPINEPHRINE (PF) 1.5 %-1:200000 IJ SOLN
INTRAMUSCULAR | Status: DC | PRN
  Administered 2016-11-24 (×2): 3 mL via EPIDURAL

## 2016-11-24 MED ORDER — SODIUM CHLORIDE 0.9% FLUSH: 3.0000 mL | INTRAVENOUS | Status: DC | PRN

## 2016-11-24 MED ORDER — FENTANYL 2.5 MCG/ML W/ROPIVACAINE 0.2% IN NS 100 ML EPIDURAL INFUSION (ARMC-ANES)
10.0000 mL/h | EPIDURAL | Status: DC
  Administered 2016-11-25: 10 mL/h via EPIDURAL

## 2016-11-24 MED ORDER — DIPHENHYDRAMINE HCL 25 MG PO CAPS: 25.0000 mg | ORAL_CAPSULE | ORAL | Status: DC | PRN

## 2016-11-24 MED ORDER — NALBUPHINE HCL 10 MG/ML IJ SOLN: 5.0000 mg | Freq: Once | INTRAMUSCULAR | Status: DC | PRN

## 2016-11-24 MED ORDER — DIPHENHYDRAMINE HCL 50 MG/ML IJ SOLN: 12.5000 mg | INTRAMUSCULAR | Status: DC | PRN

## 2016-11-24 MED ORDER — FENTANYL 2.5 MCG/ML W/ROPIVACAINE 0.2% IN NS 100 ML EPIDURAL INFUSION (ARMC-ANES)
EPIDURAL | Status: AC
  Filled 2016-11-24: qty 100

## 2016-11-24 MED ORDER — FENTANYL 2.5 MCG/ML W/ROPIVACAINE 0.2% IN NS 100 ML EPIDURAL INFUSION (ARMC-ANES)
EPIDURAL | Status: DC | PRN
  Administered 2016-11-24: 10 mL/h via EPIDURAL

## 2016-11-24 MED ORDER — LIDOCAINE HCL (PF) 1 % IJ SOLN
INTRAMUSCULAR | Status: DC | PRN
  Administered 2016-11-24 (×3): 1 mL via INTRADERMAL

## 2016-11-24 MED ORDER — SODIUM CHLORIDE 0.9 % IV SOLN
INTRAVENOUS | Status: DC | PRN
  Administered 2016-11-24 (×4): 5 mL via EPIDURAL

## 2016-11-24 MED ORDER — DEXTROSE 5 % IV SOLN
1.0000 ug/kg/h | INTRAVENOUS | Status: DC | PRN
  Filled 2016-11-24: qty 2

## 2016-11-24 NOTE — Anesthesia Preprocedure Evaluation (Addendum)
Anesthesia Evaluation  Patient identified by MRN, date of birth, ID band Patient awake  General Assessment Comment:Pt had sprite at 7:00 am. Discussed with surgeon who states that pt will be ruptured > 24 and they need to proceed now. Pt and surgeon understand risks and desire to proceed.  Reviewed: Allergy & Precautions, H&P , NPO status , Patient's Chart, lab work & pertinent test results  History of Anesthesia Complications Negative for: history of anesthetic complications  Airway Mallampati: III  TM Distance: >3 FB Neck ROM: full    Dental  (+) Poor Dentition, Chipped   Pulmonary asthma , Current Smoker,    Pulmonary exam normal breath sounds clear to auscultation       Cardiovascular Exercise Tolerance: Good hypertension, Normal cardiovascular exam Rhythm:regular Rate:Normal     Neuro/Psych    GI/Hepatic   Endo/Other    Renal/GU      Musculoskeletal   Abdominal   Peds  Hematology negative hematology ROS (+)   Anesthesia Other Findings Patient reports problems with back pain, says she is having lower back pain today  Past Medical History: No date: Cervical cancer ([place]) PreE on Mg Past Surgical History: No date: CERVICAL BIOPSY  W/ LOOP ELECTRODE EXCISION No date: FRACTURE SURGERY  BMI    Body Mass Index:  36.02 kg/m      Reproductive/Obstetrics (+) Pregnancy                          Anesthesia Physical Anesthesia Plan  ASA: III and emergent  Anesthesia Plan: Spinal   Post-op Pain Management:    Induction:   Airway Management Planned:   Additional Equipment:   Intra-op Plan:   Post-operative Plan:   Informed Consent: I have reviewed the patients History and Physical, chart, labs and discussed the procedure including the risks, benefits and alternatives for the proposed anesthesia with the patient or authorized representative who has indicated his/her understanding  and acceptance.     Plan Discussed with: Anesthesiologist  Anesthesia Plan Comments: (Anesthesia MD was notified around 0500 for C section plan.  OB team requesting to start section around 0700.  I requested that we start either before or after 0700 as that is Anesthesia and OB nursing shift change.  OB team elected for 0730.)     Anesthesia Quick Evaluation

## 2016-11-24 NOTE — Progress Notes (Signed)
Dr. Magdalene Patricia at beside assessing patient's pain/epidural. Patient c/o of lower abdominal pain with contractions, rating pain 6 out of 10. Verbal orders to increase epidural rate to 20 for an hour. After an hour to change rate to 12. Rate increase to 20 at 2030. Will continue to assess patient's pain.

## 2016-11-24 NOTE — Progress Notes (Signed)
LABOR NOTE   Lauren Johnson 31 y.o.G1P0@ at [redacted]w[redacted]d Prolonged latent labor. Second day induction for Pre ecampsia  SUBJECTIVE: She denies visual disturbance, headaches, and epigastric pain.  OBJECTIVE:  BP (!) 152/90 (BP Location: Right Arm)   Pulse 77   Temp 97.8 F (36.6 C) (Oral)   Resp 15   Ht 5\' 7"  (1.702 m)   Wt 230 lb (104.3 kg)   LMP 02/21/2016   SpO2 99%   BMI 36.02 kg/m  Total I/O In: -  Out: 460 [Urine:460]   Lungs clear bilaterally No clonus Reflexes 2 +   She has shown cervical change. CERVIX: 4 cm:100%: -2: mid position: soft SVE:   Dilation: 4 Effacement (%): 100 Station: -2 Exam by:: Philip Aspen, CNM CONTRACTIONS: regular, every 3 minutes FHR: Fetal heart tracing reviewed. Baseline: 125 bpm, Variability: Good {> 6 bpm), Accelerations: Reactive and Decelerations: Absent Category I   Analgesia: Epidural  Labs: Lab Results  Component Value Date   WBC 13.0 (H) 11/24/2016   HGB 12.9 11/24/2016   HCT 36.8 11/24/2016   MCV 86.9 11/24/2016   PLT 323 11/24/2016    ASSESSMENT: 1) Labor curve reviewed.       Progress: Early latent labor.     Membranes: ruptured, clear fluid       Active Problems:   Encounter for induction of labor   PLAN: IV Pitocin induction and Continue Magnesium Sulfate Therapy   Philip Aspen, CNM 11/24/2016 8:30 PM

## 2016-11-24 NOTE — Anesthesia Procedure Notes (Addendum)
Epidural Patient location during procedure: OB Start time: 11/24/2016 4:56 PM End time: 11/24/2016 5:08 PM  Staffing Anesthesiologist: Katy Fitch K Performed: anesthesiologist   Preanesthetic Checklist Completed: patient identified, site marked, surgical consent, pre-op evaluation, timeout performed, IV checked, risks and benefits discussed and monitors and equipment checked  Epidural Patient position: sitting Prep: Betadine Patient monitoring: heart rate, continuous pulse ox and blood pressure Approach: midline Location: L4-L5 Injection technique: LOR saline  Needle:  Needle type: Tuohy  Needle gauge: 17 G Needle length: 9 cm and 9 Needle insertion depth: 7 cm Catheter type: closed end flexible Catheter size: 19 Gauge Catheter at skin depth: 11 cm Test dose: negative and 1.5% lidocaine with Epi 1:200 K  Assessment Sensory level: T10 Events: blood not aspirated, injection not painful, no injection resistance, negative IV test and no paresthesia  Additional Notes 2 attempts. Right sided parasthenia that resolved with retraction and redirection of needle  Pt. Evaluated and documentation done after procedure finished. Patient identified. Risks/Benefits/Options discussed with patient including but not limited to bleeding, infection, nerve damage, paralysis, failed block, incomplete pain control, headache, blood pressure changes, nausea, vomiting, reactions to medication both or allergic, itching and postpartum back pain. Confirmed with bedside nurse the patient's most recent platelet count. Confirmed with patient that they are not currently taking any anticoagulation, have any bleeding history or any family history of bleeding disorders. Patient expressed understanding and wished to proceed. All questions were answered. Sterile technique was used throughout the entire procedure. Please see nursing notes for vital signs. Test dose was given through epidural catheter and  negative prior to continuing to dose epidural or start infusion. Warning signs of high block given to the patient including shortness of breath, tingling/numbness in hands, complete motor block, or any concerning symptoms with instructions to call for help. Patient was given instructions on fall risk and not to get out of bed. All questions and concerns addressed with instructions to call with any issues or inadequate analgesia.   Patient tolerated the insertion well without immediate complications.Reason for block:procedure for pain

## 2016-11-24 NOTE — Anesthesia Procedure Notes (Addendum)
Epidural Patient location during procedure: OB Start time: 11/24/2016 10:23 PM End time: 11/24/2016 10:28 PM  Staffing Anesthesiologist: Katy Fitch K Performed: anesthesiologist   Preanesthetic Checklist Completed: patient identified, site marked, surgical consent, pre-op evaluation, timeout performed, IV checked, risks and benefits discussed and monitors and equipment checked  Epidural Patient position: sitting Prep: Betadine Patient monitoring: heart rate, continuous pulse ox and blood pressure Approach: midline Location: L3-L4 Injection technique: LOR saline  Needle:  Needle type: Tuohy  Needle gauge: 17 G Needle length: 9 cm and 9 Needle insertion depth: 8 cm Catheter type: closed end flexible Catheter size: 19 Gauge Catheter at skin depth: 13 cm Test dose: negative and 1.5% lidocaine with Epi 1:200 K  Assessment Sensory level: T10 Events: blood not aspirated, injection not painful, no injection resistance, negative IV test and no paresthesia  Additional Notes 1 attempt. This epidural catheter was secured with extra steri strips and left 5 cm in space to try to prevent catheter from falling out again. Pt. Evaluated and documentation done after procedure finished. Patient identified. Risks/Benefits/Options discussed with patient including but not limited to bleeding, infection, nerve damage, paralysis, failed block, incomplete pain control, headache, blood pressure changes, nausea, vomiting, reactions to medication both or allergic, itching and postpartum back pain. Confirmed with bedside nurse the patient's most recent platelet count. Confirmed with patient that they are not currently taking any anticoagulation, have any bleeding history or any family history of bleeding disorders. Patient expressed understanding and wished to proceed. All questions were answered. Sterile technique was used throughout the entire procedure. Please see nursing notes for vital signs. Test  dose was given through epidural catheter and negative prior to continuing to dose epidural or start infusion. Warning signs of high block given to the patient including shortness of breath, tingling/numbness in hands, complete motor block, or any concerning symptoms with instructions to call for help. Patient was given instructions on fall risk and not to get out of bed. All questions and concerns addressed with instructions to call with any issues or inadequate analgesia.   Patient tolerated the insertion well without immediate complications.Reason for block:procedure for pain

## 2016-11-25 ENCOUNTER — Encounter
Admission: EM | Disposition: A | Discharge status: Home / Self Care | Payer: Self-pay | Source: Home / Self Care | Attending: Obstetrics and Gynecology

## 2016-11-25 ENCOUNTER — Encounter: Payer: Self-pay | Admitting: Anesthesiology

## 2016-11-25 DIAGNOSIS — Z3A39 39 weeks gestation of pregnancy: Secondary | ICD-10-CM

## 2016-11-25 DIAGNOSIS — Z98891 History of uterine scar from previous surgery: Secondary | ICD-10-CM

## 2016-11-25 DIAGNOSIS — O1404 Mild to moderate pre-eclampsia, complicating childbirth: Secondary | ICD-10-CM

## 2016-11-25 LAB — CBC
HCT: 37.4 % (ref 35.0–47.0)
Hemoglobin: 12.8 g/dL (ref 12.0–16.0)
MCH: 29.9 pg (ref 26.0–34.0)
MCHC: 34.3 g/dL (ref 32.0–36.0)
MCV: 87.3 fL (ref 80.0–100.0)
PLATELETS: 331 10*3/uL (ref 150–440)
RBC: 4.29 MIL/uL (ref 3.80–5.20)
RDW: 15.5 % — AB (ref 11.5–14.5)
WBC: 17 10*3/uL — AB (ref 3.6–11.0)

## 2016-11-25 SURGERY — Surgical Case
Anesthesia: Spinal

## 2016-11-25 MED ORDER — EPHEDRINE SULFATE 50 MG/ML IJ SOLN
INTRAMUSCULAR | Status: DC | PRN
  Administered 2016-11-25: 10 mg via INTRAVENOUS

## 2016-11-25 MED ORDER — MORPHINE SULFATE (PF) 0.5 MG/ML IJ SOLN
INTRAMUSCULAR | Status: AC
  Filled 2016-11-25: qty 10

## 2016-11-25 MED ORDER — OXYCODONE-ACETAMINOPHEN 5-325 MG PO TABS
2.0000 | ORAL_TABLET | ORAL | Status: DC | PRN
  Administered 2016-11-27: 2 via ORAL
  Filled 2016-11-25: qty 2

## 2016-11-25 MED ORDER — BUTORPHANOL TARTRATE 1 MG/ML IJ SOLN
INTRAMUSCULAR | Status: AC
  Administered 2016-11-25: 2 mg via INTRAVENOUS
  Filled 2016-11-25: qty 2

## 2016-11-25 MED ORDER — CEFAZOLIN SODIUM-DEXTROSE 2-4 GM/100ML-% IV SOLN: 2.0000 g | INTRAVENOUS | Status: DC

## 2016-11-25 MED ORDER — MEASLES, MUMPS & RUBELLA VAC ~~LOC~~ INJ
0.5000 mL | INJECTION | Freq: Once | SUBCUTANEOUS | Status: DC
  Filled 2016-11-25: qty 0.5

## 2016-11-25 MED ORDER — NALOXONE HCL 2 MG/2ML IJ SOSY: 1.0000 ug/kg/h | PREFILLED_SYRINGE | INTRAMUSCULAR | Status: DC | PRN

## 2016-11-25 MED ORDER — PRENATAL MULTIVITAMIN CH
1.0000 | ORAL_TABLET | Freq: Every day | ORAL | Status: DC
  Administered 2016-11-25 – 2016-11-27 (×3): 1 via ORAL
  Filled 2016-11-25 (×5): qty 1

## 2016-11-25 MED ORDER — CEFAZOLIN SODIUM 1 G IJ SOLR
2000.0000 mg | INTRAMUSCULAR | Status: AC
  Administered 2016-11-25: 2000 mg via INTRAVENOUS
  Filled 2016-11-25: qty 20

## 2016-11-25 MED ORDER — LACTATED RINGERS IV SOLN
INTRAVENOUS | Status: DC | PRN
  Administered 2016-11-25: 08:00:00 via INTRAVENOUS

## 2016-11-25 MED ORDER — FENTANYL 2.5 MCG/ML W/ROPIVACAINE 0.2% IN NS 100 ML EPIDURAL INFUSION (ARMC-ANES)
EPIDURAL | Status: AC
  Administered 2016-11-25: 10 mL/h via EPIDURAL
  Filled 2016-11-25: qty 100

## 2016-11-25 MED ORDER — MAGNESIUM SULFATE 50 % IJ SOLN
2.0000 g/h | INTRAMUSCULAR | Status: DC
  Administered 2016-11-25 (×2): 2 g/h via INTRAVENOUS
  Filled 2016-11-25: qty 80

## 2016-11-25 MED ORDER — FERROUS SULFATE 325 (65 FE) MG PO TABS
325.0000 mg | ORAL_TABLET | Freq: Two times a day (BID) | ORAL | Status: DC
  Administered 2016-11-25 – 2016-11-27 (×4): 325 mg via ORAL
  Filled 2016-11-25 (×4): qty 1

## 2016-11-25 MED ORDER — SODIUM CHLORIDE FLUSH 0.9 % IV SOLN
INTRAVENOUS | Status: AC
  Filled 2016-11-25: qty 10

## 2016-11-25 MED ORDER — SIMETHICONE 80 MG PO CHEW: 80.0000 mg | CHEWABLE_TABLET | ORAL | Status: DC | PRN

## 2016-11-25 MED ORDER — GLYCOPYRROLATE 0.2 MG/ML IJ SOLN
INTRAMUSCULAR | Status: AC
  Filled 2016-11-25: qty 1

## 2016-11-25 MED ORDER — NALBUPHINE HCL 10 MG/ML IJ SOLN: 5.0000 mg | INTRAMUSCULAR | Status: DC | PRN

## 2016-11-25 MED ORDER — SIMETHICONE 80 MG PO CHEW
80.0000 mg | CHEWABLE_TABLET | Freq: Three times a day (TID) | ORAL | Status: DC
  Administered 2016-11-26 – 2016-11-27 (×4): 80 mg via ORAL
  Filled 2016-11-25 (×3): qty 1

## 2016-11-25 MED ORDER — NALBUPHINE HCL 10 MG/ML IJ SOLN: 5.0000 mg | Freq: Once | INTRAMUSCULAR | Status: DC | PRN

## 2016-11-25 MED ORDER — MAGNESIUM SULFATE 50 % IJ SOLN
INTRAMUSCULAR | Status: AC
  Filled 2016-11-25: qty 80

## 2016-11-25 MED ORDER — SENNOSIDES-DOCUSATE SODIUM 8.6-50 MG PO TABS
2.0000 | ORAL_TABLET | ORAL | Status: DC
  Administered 2016-11-26: 2 via ORAL
  Filled 2016-11-25: qty 2

## 2016-11-25 MED ORDER — LIDOCAINE 5 % EX PTCH: 1.0000 | MEDICATED_PATCH | CUTANEOUS | Status: DC

## 2016-11-25 MED ORDER — ONDANSETRON HCL 4 MG/2ML IJ SOLN: 4.0000 mg | Freq: Three times a day (TID) | INTRAMUSCULAR | Status: DC | PRN

## 2016-11-25 MED ORDER — CARBOPROST TROMETHAMINE 250 MCG/ML IM SOLN
INTRAMUSCULAR | Status: DC | PRN
  Administered 2016-11-25: 250 ug via INTRAMUSCULAR

## 2016-11-25 MED ORDER — LIDOCAINE 5 % EX PTCH
MEDICATED_PATCH | CUTANEOUS | Status: DC | PRN
  Administered 2016-11-25: 1 via TRANSDERMAL

## 2016-11-25 MED ORDER — LACTATED RINGERS IV SOLN
INTRAVENOUS | Status: DC
  Administered 2016-11-25: 14:00:00 via INTRAVENOUS

## 2016-11-25 MED ORDER — DIBUCAINE 1 % RE OINT
1.0000 | TOPICAL_OINTMENT | RECTAL | Status: DC | PRN
Start: 2016-11-25 — End: 2016-11-27

## 2016-11-25 MED ORDER — EPHEDRINE SULFATE 50 MG/ML IJ SOLN
INTRAMUSCULAR | Status: AC
  Filled 2016-11-25: qty 1

## 2016-11-25 MED ORDER — ONDANSETRON HCL 4 MG/2ML IJ SOLN
INTRAMUSCULAR | Status: DC | PRN
  Administered 2016-11-25: 4 mg via INTRAVENOUS

## 2016-11-25 MED ORDER — ONDANSETRON HCL 4 MG/2ML IJ SOLN: 4.0000 mg | Freq: Once | INTRAMUSCULAR | Status: DC | PRN

## 2016-11-25 MED ORDER — DIPHENHYDRAMINE HCL 25 MG PO CAPS: 25.0000 mg | ORAL_CAPSULE | ORAL | Status: DC | PRN

## 2016-11-25 MED ORDER — CARBOPROST TROMETHAMINE 250 MCG/ML IM SOLN: 250.0000 ug | Freq: Once | INTRAMUSCULAR | Status: DC

## 2016-11-25 MED ORDER — OXYTOCIN 40 UNITS IN LACTATED RINGERS INFUSION - SIMPLE MED
2.5000 [IU]/h | INTRAVENOUS | Status: DC
  Administered 2016-11-25: 2.5 [IU]/h via INTRAVENOUS

## 2016-11-25 MED ORDER — SODIUM CHLORIDE FLUSH 0.9 % IV SOLN
INTRAVENOUS | Status: AC
  Filled 2016-11-25: qty 20

## 2016-11-25 MED ORDER — DIPHENHYDRAMINE HCL 25 MG PO CAPS: 25.0000 mg | ORAL_CAPSULE | Freq: Four times a day (QID) | ORAL | Status: DC | PRN

## 2016-11-25 MED ORDER — OXYCODONE-ACETAMINOPHEN 5-325 MG PO TABS
1.0000 | ORAL_TABLET | ORAL | Status: DC | PRN
  Administered 2016-11-26 – 2016-11-27 (×4): 1 via ORAL
  Filled 2016-11-25 (×4): qty 1

## 2016-11-25 MED ORDER — IBUPROFEN 800 MG PO TABS
800.0000 mg | ORAL_TABLET | Freq: Three times a day (TID) | ORAL | Status: DC
  Administered 2016-11-26 – 2016-11-27 (×5): 800 mg via ORAL
  Filled 2016-11-25 (×6): qty 1

## 2016-11-25 MED ORDER — SIMETHICONE 80 MG PO CHEW
80.0000 mg | CHEWABLE_TABLET | ORAL | Status: DC
  Filled 2016-11-25: qty 1

## 2016-11-25 MED ORDER — IBUPROFEN 600 MG PO TABS
600.0000 mg | ORAL_TABLET | Freq: Four times a day (QID) | ORAL | Status: DC | PRN
  Administered 2016-11-25 (×2): 600 mg via ORAL
  Filled 2016-11-25 (×2): qty 1

## 2016-11-25 MED ORDER — COCONUT OIL OIL: 1.0000 "application " | TOPICAL_OIL | Status: DC | PRN

## 2016-11-25 MED ORDER — MENTHOL 3 MG MT LOZG
1.0000 | LOZENGE | OROMUCOSAL | Status: DC | PRN
  Filled 2016-11-25: qty 9

## 2016-11-25 MED ORDER — BUTORPHANOL TARTRATE 1 MG/ML IJ SOLN
2.0000 mg | INTRAMUSCULAR | Status: DC | PRN
  Administered 2016-11-25: 2 mg via INTRAVENOUS

## 2016-11-25 MED ORDER — PHENYLEPHRINE 40 MCG/ML (10ML) SYRINGE FOR IV PUSH (FOR BLOOD PRESSURE SUPPORT)
PREFILLED_SYRINGE | INTRAVENOUS | Status: DC | PRN
  Administered 2016-11-25 (×2): 100 ug via INTRAVENOUS

## 2016-11-25 MED ORDER — LABETALOL HCL 5 MG/ML IV SOLN
20.0000 mg | INTRAVENOUS | Status: DC | PRN
  Administered 2016-11-25: 40 mg via INTRAVENOUS
  Administered 2016-11-25: 20 mg via INTRAVENOUS
  Filled 2016-11-25: qty 8
  Filled 2016-11-25: qty 16

## 2016-11-25 MED ORDER — FENTANYL CITRATE (PF) 100 MCG/2ML IJ SOLN: 25.0000 ug | INTRAMUSCULAR | Status: DC | PRN

## 2016-11-25 MED ORDER — MEPERIDINE HCL 25 MG/ML IJ SOLN: 6.2500 mg | INTRAMUSCULAR | Status: DC | PRN

## 2016-11-25 MED ORDER — WITCH HAZEL-GLYCERIN EX PADS
1.0000 | MEDICATED_PAD | CUTANEOUS | Status: DC | PRN
Start: 2016-11-25 — End: 2016-11-27

## 2016-11-25 MED ORDER — LABETALOL HCL 5 MG/ML IV SOLN
20.0000 mg | INTRAVENOUS | Status: DC | PRN
  Filled 2016-11-25: qty 16
  Filled 2016-11-25: qty 4

## 2016-11-25 MED ORDER — OXYTOCIN 10 UNIT/ML IJ SOLN
INTRAVENOUS | Status: DC | PRN
  Administered 2016-11-25: 40 [IU] via INTRAVENOUS

## 2016-11-25 MED ORDER — HYDRALAZINE HCL 20 MG/ML IJ SOLN
10.0000 mg | Freq: Once | INTRAMUSCULAR | Status: DC | PRN
  Filled 2016-11-25: qty 0.5

## 2016-11-25 MED ORDER — SODIUM CHLORIDE 0.9% FLUSH: 3.0000 mL | INTRAVENOUS | Status: DC | PRN

## 2016-11-25 MED ORDER — ACETAMINOPHEN 325 MG PO TABS: 650.0000 mg | ORAL_TABLET | ORAL | Status: DC | PRN

## 2016-11-25 MED ORDER — LIDOCAINE 5 % EX PTCH
MEDICATED_PATCH | CUTANEOUS | Status: AC
Start: 2016-11-25 — End: 2016-11-25
  Filled 2016-11-25: qty 1

## 2016-11-25 MED ORDER — MISOPROSTOL 200 MCG PO TABS
ORAL_TABLET | ORAL | Status: AC
  Filled 2016-11-25: qty 5

## 2016-11-25 MED ORDER — LOPERAMIDE HCL 2 MG PO CAPS
4.0000 mg | ORAL_CAPSULE | ORAL | Status: DC | PRN
  Administered 2016-11-25: 4 mg via ORAL
  Filled 2016-11-25 (×2): qty 2

## 2016-11-25 MED ORDER — TETANUS-DIPHTH-ACELL PERTUSSIS 5-2.5-18.5 LF-MCG/0.5 IM SUSP
0.5000 mL | Freq: Once | INTRAMUSCULAR | Status: DC
  Filled 2016-11-25: qty 0.5

## 2016-11-25 MED ORDER — LIDOCAINE HCL 2 % EX GEL
CUTANEOUS | Status: AC
  Filled 2016-11-25: qty 5

## 2016-11-25 MED ORDER — PHENYLEPHRINE HCL 10 MG/ML IJ SOLN
INTRAMUSCULAR | Status: AC
  Filled 2016-11-25: qty 1

## 2016-11-25 MED ORDER — CARBOPROST TROMETHAMINE 250 MCG/ML IM SOLN
INTRAMUSCULAR | Status: AC
  Filled 2016-11-25: qty 1

## 2016-11-25 MED ORDER — LIDOCAINE 5 % EX PTCH
1.0000 | MEDICATED_PATCH | CUTANEOUS | Status: DC
  Administered 2016-11-27: 1 via TRANSDERMAL
  Filled 2016-11-25: qty 1

## 2016-11-25 MED ORDER — SCOPOLAMINE 1 MG/3DAYS TD PT72: 1.0000 | MEDICATED_PATCH | Freq: Once | TRANSDERMAL | Status: DC

## 2016-11-25 MED ORDER — OXYTOCIN 40 UNITS IN LACTATED RINGERS INFUSION - SIMPLE MED
INTRAVENOUS | Status: AC
  Filled 2016-11-25: qty 1000

## 2016-11-25 MED ORDER — NALOXONE HCL 0.4 MG/ML IJ SOLN: 0.4000 mg | INTRAMUSCULAR | Status: DC | PRN

## 2016-11-25 MED ORDER — DIPHENHYDRAMINE HCL 50 MG/ML IJ SOLN
12.5000 mg | INTRAMUSCULAR | Status: DC | PRN
  Administered 2016-11-25 (×2): 12.5 mg via INTRAVENOUS
  Filled 2016-11-25 (×2): qty 1

## 2016-11-25 MED ORDER — MORPHINE SULFATE (PF) 2 MG/ML IV SOLN: 2.0000 mg | INTRAVENOUS | Status: DC | PRN

## 2016-11-25 SURGICAL SUPPLY — 24 items
BENZOIN TINCTURE PRP APPL 2/3 (GAUZE/BANDAGES/DRESSINGS) ×3 IMPLANT
CANISTER SUCT 3000ML (MISCELLANEOUS) ×3 IMPLANT
CHLORAPREP W/TINT 26ML (MISCELLANEOUS) ×3 IMPLANT
DERMABOND ADVANCED (GAUZE/BANDAGES/DRESSINGS) ×2
DERMABOND ADVANCED .7 DNX12 (GAUZE/BANDAGES/DRESSINGS) ×1 IMPLANT
DRSG TELFA 3X8 NADH (GAUZE/BANDAGES/DRESSINGS) ×3 IMPLANT
ELECT REM PT RETURN 9FT ADLT (ELECTROSURGICAL) ×3
ELECTRODE REM PT RTRN 9FT ADLT (ELECTROSURGICAL) ×1 IMPLANT
GAUZE SPONGE 4X4 12PLY STRL (GAUZE/BANDAGES/DRESSINGS) ×3 IMPLANT
GLOVE BIO SURGEON STRL SZ8 (GLOVE) ×3 IMPLANT
GOWN STRL REUS W/ TWL LRG LVL3 (GOWN DISPOSABLE) ×2 IMPLANT
GOWN STRL REUS W/ TWL XL LVL3 (GOWN DISPOSABLE) ×1 IMPLANT
GOWN STRL REUS W/TWL LRG LVL3 (GOWN DISPOSABLE) ×4
GOWN STRL REUS W/TWL XL LVL3 (GOWN DISPOSABLE) ×2
NS IRRIG 1000ML POUR BTL (IV SOLUTION) ×3 IMPLANT
PACK C SECTION AR (MISCELLANEOUS) ×3 IMPLANT
PAD OB MATERNITY 4.3X12.25 (PERSONAL CARE ITEMS) ×3 IMPLANT
PAD PREP 24X41 OB/GYN DISP (PERSONAL CARE ITEMS) ×3 IMPLANT
STRAP SAFETY BODY (MISCELLANEOUS) ×3 IMPLANT
SUT CHROMIC 1-0 (SUTURE) ×9 IMPLANT
SUT MAXON ABS #0 GS21 30IN (SUTURE) ×6 IMPLANT
SUT PLAIN GUT 0 (SUTURE) ×6 IMPLANT
SUT VIC AB 2-0 CT1 27 (SUTURE) ×4
SUT VIC AB 2-0 CT1 TAPERPNT 27 (SUTURE) ×2 IMPLANT

## 2016-11-25 NOTE — Progress Notes (Signed)
PRE OP C/S COUNSELING NOTE: Pre OP Diagnosis: 1. 39.5 week IUP with Arrest of Dilation 2. Pre Eclampsia 3. S/P Cytotec/Pitocin IOL  The patient has been counseled regarding indications for Cesarean Section delivery. (See intra partum notes for details). She is understanding of the planned procedure and is aware of and accepting of all surgical risks which include but are not limited to: Bleeding with possible need for blood product transfusions; Infection with possible need for additional antibiotic therapy; Blood Clot Disorders with possible need for blood thinners; Pelvic organ Injury with possible need for repair (Bowel/Bladder/Ureters/Ovaries/Tubes/etc); Anesthesia risks; Fetal Injury; and additional Post Op complications including incision dehiscence and possible future pregnancy placenta implantation issues. All questions have been answered. Informed consent is given.  Rosetta Rupnow A.Zipporah Plants, MD, FACOG [facility]

## 2016-11-25 NOTE — Transfer of Care (Signed)
Immediate Anesthesia Transfer of Care Note  Patient: Lauren Johnson  Procedure(s) Performed: Procedure(s): CESAREAN SECTION (N/A)  Patient Location: PACU  Anesthesia Type:Spinal  Level of Consciousness: awake, alert  and oriented  Airway & Oxygen Therapy: Patient Spontanous Breathing and Patient connected to nasal cannula oxygen  Post-op Assessment: Report given to RN and Post -op Vital signs reviewed and stable  Post vital signs: Reviewed and stable  Last Vitals:  Vitals:   11/25/16 0601 11/25/16 0700  BP: 140/81 (!) 144/83  Pulse: 73 81  Resp: 16 16  Temp: 36.3 C     Last Pain:  Vitals:   11/25/16 0601  TempSrc: Oral  PainSc:       Patients Stated Pain Goal: 0 (77/41/42 3953)  Complications: No apparent anesthesia complications

## 2016-11-25 NOTE — Progress Notes (Signed)
ADDENDUM: Patient had sip of sprite at 7 am.  Patient will be greater than 24 hours with ruptured membranes at 9 am; with any further delay for Cesasrean section, increased morbidity from infection becomes a significant risk. I recommend proceeding with C/S at this time.  Brayton Mars, MD

## 2016-11-25 NOTE — Progress Notes (Signed)
LABOR NOTE   Lauren Johnson 31 y.o.GP@ at [redacted]w[redacted]d Active phase labor. Induction with pitocin for Pre Eclampsia. On Magnesium Sulfate Therapy @ 2 g/hr.    SUBJECTIVE:  She denies headache , visual disturbances, and epigastric pain. She is in significant pain. The epidural was replaced by anesthesia at approximately 1030 pm.  She is crying in pain with contractions.  OBJECTIVE:  BP (!) 152/81 (BP Location: Right Arm)   Pulse 77   Temp 97.7 F (36.5 C) (Oral)   Resp 16   Ht 5\' 7"  (1.702 m)   Wt 230 lb (104.3 kg)   LMP 02/21/2016   SpO2 97%   BMI 36.02 kg/m  Total I/O In: 1545.5 [P.O.:500; I.V.:1007.5; Other:38] Out: 2110 [Urine:2110]  She has shown cervical change. CERVIX: 7 cm:100%: -2: mid position: soft SVE:   Dilation: 7 Effacement (%): 100 Station: -2 Exam by:: Philip Aspen, CNM  CONTRACTIONS: regular, every 3 minutes FHR: Fetal heart tracing reviewed. Baseline: 125 bpm, Variability: Good {> 6 bpm), Accelerations: Reactive and Decelerations: Early Category I   Analgesia: Epidural  Labs: Lab Results  Component Value Date   WBC 13.0 (H) 11/24/2016   HGB 12.9 11/24/2016   HCT 36.8 11/24/2016   MCV 86.9 11/24/2016   PLT 323 11/24/2016    ASSESSMENT: 1) Labor curve reviewed.       Progress: Active phase labor., Inadequate uterine activity - intensity or frequency. and Dysfunctional uterine contractions.     Membranes: ruptured, clear fluid       Active Problems:   Encounter for induction of labor Pre Eclampsia   PLAN: Discussed plan of care with pt. Reviewed options of having Anesthesia evaluate epidural.She does not want to have epidural replaced again and is requesting a cesarean section. Dr.DeFrancesco consulted regarding plan of car.  Plan for Cesarean delivery @ approximately  0700. Pitocin off, IV stadol to help control pain .   Philip Aspen, CNM 11/25/2016 4:42 AM

## 2016-11-25 NOTE — Progress Notes (Signed)
Patient requested to step out of bed and walk around for a bit. RN instructed patient that she is a high fall risk and that it is highly recommended that she stay in bed while on Magnesium. RN educated patient that with the high alert med, post-operative, and elevated blood pressure that staying in bed is most recommended. Patient verbalized understanding. Philip Aspen, CNM was notified at 2155 of patient's request.

## 2016-11-25 NOTE — Anesthesia Post-op Follow-up Note (Cosign Needed)
Anesthesia QCDR form completed.        

## 2016-11-26 LAB — CBC
HCT: 26.4 % — ABNORMAL LOW (ref 35.0–47.0)
Hemoglobin: 9.5 g/dL — ABNORMAL LOW (ref 12.0–16.0)
MCH: 31.3 pg (ref 26.0–34.0)
MCHC: 35.7 g/dL (ref 32.0–36.0)
MCV: 87.6 fL (ref 80.0–100.0)
PLATELETS: 302 10*3/uL (ref 150–440)
RBC: 3.02 MIL/uL — AB (ref 3.80–5.20)
RDW: 15.2 % — AB (ref 11.5–14.5)
WBC: 11.7 10*3/uL — AB (ref 3.6–11.0)

## 2016-11-26 NOTE — Anesthesia Postprocedure Evaluation (Signed)
Anesthesia Post Note  Patient: Lauren Johnson  Procedure(s) Performed: Procedure(s) (LRB): CESAREAN SECTION (N/A)  Patient location during evaluation: L&D Anesthesia Type: Spinal Level of consciousness: awake and alert Pain management: pain level controlled Respiratory status: spontaneous breathing, nonlabored ventilation and respiratory function stable Cardiovascular status: stable Postop Assessment: no headache, no backache and epidural receding Anesthetic complications: no     Last Vitals:  Vitals:   11/26/16 0805 11/26/16 0922  BP: 131/64 131/75  Pulse: 93 85  Resp: 14 16  Temp: 36.6 C     Last Pain:  Vitals:   11/26/16 1101  TempSrc:   PainSc: 5                  Channa Hazelett K

## 2016-11-26 NOTE — Progress Notes (Signed)
Progress Note - Cesarean Delivery  Lauren Johnson is a 31 y.o. G1P1000 now PP day 1 s/p C-Section, Low Transverse . Postpartum Magnesium therapy for pre eclampsia. 24 hour Magnesium completed today at 0819.   Subjective:  Patient reports no problems with eating, or their wound. She denies headache, visual changes, epigastric pain.   Objective:  Vital signs in last 24 hours: Temp:  [97 F (36.1 C)-98.5 F (36.9 C)] 97.8 F (36.6 C) (04/22 0805) Pulse Rate:  [66-101] 93 (04/22 0805) Resp:  [10-28] 14 (04/22 0805) BP: (109-175)/(57-119) 131/64 (04/22 0805) SpO2:  [87 %-100 %] 93 % (04/22 0805)  Physical Exam:  General: alert, cooperative and appears stated age  Lungs: clear bilaterally HR: RRR Bowel sounds: hypoactive , abdomen slight distention  Lochia: appropriate Uterine Fundus: firm Incision: no significant drainage, dressing to be removed today DVT Evaluation: No evidence of DVT seen on physical exam.    Data Review  Recent Labs  11/25/16 0553 11/26/16 0653  HGB 12.8 9.5*  HCT 37.4 26.4*    Assessment:  Active Problems:   Encounter for induction of labor   S/P primary low transverse C-section   Status post Cesarean section. Doing well postoperatively.     Plan:       Discontinue magnesium and foley. Transfer to postpartum floor, continue to monitor blood pressure and encourage ambulation.    Philip Aspen, CNM 11/26/2016 8:23 AM

## 2016-11-26 NOTE — Anesthesia Post-op Follow-up Note (Signed)
  Anesthesia Pain Follow-up Note  Patient: Lauren Johnson  Day #: 1  Date of Follow-up: 11/26/2016 Time: 11:50 AM  Last Vitals:  Vitals:   11/26/16 0805 11/26/16 0922  BP: 131/64 131/75  Pulse: 93 85  Resp: 14 16  Temp: 36.6 C     Level of Consciousness: alert  Pain: 3 /10   Side Effects:None  Catheter Site Exam:clean, dry  Epidural / Intrathecal    Start     Dose/Rate Route Frequency Ordered Stop   11/24/16 1745  fentaNYL 2.5 mcg/ml w/ropivacaine 0.2% (preservative free) in normal saline 100 mL EPIDURAL Infusion in 150 ml Intravia Bag     10 mL/hr 10 mL/hr  Epidural Continuous 11/24/16 1732         Plan: D/C from anesthesia care at surgeon's request  [place]

## 2016-11-27 ENCOUNTER — Encounter: Payer: Self-pay | Admitting: Obstetrics and Gynecology

## 2016-11-27 MED ORDER — IBUPROFEN 600 MG PO TABS: 600.0000 mg | ORAL_TABLET | Freq: Four times a day (QID) | ORAL | 0 refills | Status: DC | PRN

## 2016-11-27 MED ORDER — OXYCODONE-ACETAMINOPHEN 5-325 MG PO TABS: 2.0000 | ORAL_TABLET | ORAL | 0 refills | Status: DC | PRN

## 2016-11-27 MED ORDER — FERROUS SULFATE 325 (65 FE) MG PO TABS: 325.0000 mg | ORAL_TABLET | Freq: Two times a day (BID) | ORAL | 3 refills | Status: DC

## 2016-11-27 MED ORDER — DOCUSATE SODIUM 100 MG PO CAPS: 100.0000 mg | ORAL_CAPSULE | Freq: Two times a day (BID) | ORAL | 0 refills | Status: DC

## 2016-11-27 NOTE — Discharge Summary (Signed)
Physician Obstetric Discharge Summary  Patient ID: Lauren Johnson MRN: [id number] DOB/AGE: [birth year] 31 y.o.  Reason for Admission: induction of labor and Pre Eclampsia Prenatal Procedures: NST and ultrasound Intrapartum Procedures: cesarean: low cervical, transverse and Cytotec/Pitocin IOL Postpartum Procedures: Magnesium Sulfate Prophylaxis Complications-Operative and Postpartum: none  Delivery Note At 8:19 AM a viable female was delivered via C-Section, Low Transverse (Presentation:OA).  APGAR: 8, 9; weight 5 lb 5.7 oz (2430 g).   Placenta status:expressed, 3VI Cord  Anesthesia:  Epidural/Spinal  Mom to postpartum.  Baby to Couplet care / Skin to Skin.  Lauren Johnson 11/27/2016, 12:58 PM  H/H:  Lab Results  Component Value Date/Time   HGB 9.5 (L) 11/26/2016 06:53 AM   HGB 12.6 06/10/2016   HCT 26.4 (L) 11/26/2016 06:53 AM   HCT 31.5 (L) 09/05/2016 09:16 AM   HCT 39 06/10/2016    Brief Hospital Course: Lauren Johnson is a G1P1000 who underwent cesarean section on 11/22/2016 - 11/25/2016 for Arrest of Dilation following Cytotec/Pitocin IOL for Pre Eclampsia.  Patient had an uncomplicated surgery; for further details of this surgery, please refer to the operative note.  Patient had an uncomplicated postpartum course.  By time of discharge on POD#2, her pain was controlled on oral pain medications; she had appropriate lochia and was ambulating, voiding without difficulty, tolerating regular diet and passing flatus. BP was controlled without medication.  She was deemed stable for discharge to home.    Discharge Diagnoses: Term Pregnancy-delivered, Failed induction, Preelampsia and Arrest of Dilation  Discharge Information: Date: 11/27/2016 Activity: pelvic rest Diet: routine Baby feeding: plans to bottle feed Contraception: condoms Medications: PNV, Ibuprofen, Colace, Iron and Percocet Discharged Condition: good Instructions: refer to practice specific  booklet Discharge to: home  Signed: Brayton Mars, MD 11/27/2016, 12:58 PM

## 2016-11-27 NOTE — Progress Notes (Signed)
All discharge instructions given to patient and she voices understanding of all instructions given. presription given. She is aware of incision f/u appt date and time. Patient discharged home with infant and family escorted out by volunteers in wheelchair.

## 2016-11-27 NOTE — Discharge Instructions (Signed)
Follow up sooner with fever greater than 100.5, problems breathing, pain not helped by medications, severe depression ( more than just baby blues, wanting to hurt yourself or the baby), severe bleeding( saturating more than one pad an hour or large palm sized clots),or any incisional concerns such as increased redness, swelling, discharge or increased pain in incision.  No heavy lifting for 6 weeks.  No driving while taking narcotics. No douches, intercourse, tampons, or enemas for 6 weeks.

## 2016-11-27 NOTE — Discharge Summary (Signed)
@LOGO @  Physician Obstetric Discharge Summary  Patient ID: Lauren Johnson MRN: [id number] DOB/AGE: 1985/11/15 31 y.o.   Date of Admission: 11/22/2016  Date of Discharge: 11/27/2016  Admitting Diagnosis: Induction of labor at [redacted]w[redacted]d  Secondary Diagnosis: Preeclampsia  Mode of Delivery: primary cesarean section, low uterine, transverse     Discharge Diagnosis: Failed induction of labor, Preeclampsia (mild) and Reasons for cesarean section  Arrest of Dilation   Intrapartum Procedures: Atificial rupture of membranes, epidural, pitocin augmentation, placement of fetal scalp electrode and placement of intrauterine catheter   Post partum procedures: magnesuim sulfate therapy  Complications: none                        Discharge Day SOAP Note:  Subjective:  The patient has no complaints.  She is ambulating well. She is passing gas but has not had a bowel movement. She is taking PO well. Pain is well controlled with current medications. Patient is urinating without difficulty.      Objective  Vital signs in last 24 hours: BP (!) 142/70   Pulse 91   Temp 98.4 F (36.9 C) (Oral)   Resp 20   Ht 5\' 7"  (1.702 m)   Wt 230 lb (104.3 kg)   LMP 02/21/2016   SpO2 97%   Breastfeeding? Unknown   BMI 36.02 kg/m   Physical Exam: Gen: NAD Lungs clear bilaterally HR: RRR Abdomen:  clean, dry, no drainage Bowel sounds present No Signs of DVT Fundus Fundal Tone: Firm  Lochia Amount: Small       Data Review Labs: CBC Latest Ref Rng & Units 11/26/2016 11/25/2016 11/24/2016  WBC 3.6 - 11.0 K/uL 11.7(H) 17.0(H) 13.0(H)  Hemoglobin 12.0 - 16.0 g/dL 9.5(L) 12.8 12.9  Hematocrit 35.0 - 47.0 % 26.4(L) 37.4 36.8  Platelets 150 - 440 K/uL 302 331 323   O POS  Assessment:  Active Problems:   Encounter for induction of labor   S/P primary low transverse C-section   Doing well.  Normal progress as expected.    Plan:  Discharge to home  Modified rest as directed - may slowly  resume normal activities with restrictions  as discussed.  Medications as written.  See below for additional.  Follow up one week for incision check    Discharge Instructions: Per After Visit Summary. Activity: Advance as tolerated. Pelvic rest for 6 weeks.  Also refer to After Visit Summary.  Wound care discussed. Reviewed signs and symptoms of infection and when to call for appointment. Discussed baby blues and postpartum depression.  Diet: Regular Medications: Allergies as of 11/27/2016      Reactions   Codeine Hives   Latex Other (See Comments)   Throat swells      Medication List    STOP taking these medications   ciprofloxacin 250 MG tablet Commonly known as:  CIPRO     TAKE these medications   docusate sodium 100 MG capsule Commonly known as:  COLACE Take 1 capsule (100 mg total) by mouth 2 (two) times daily.   ferrous sulfate 325 (65 FE) MG tablet Take 1 tablet (325 mg total) by mouth 2 (two) times daily with a meal.   ibuprofen 600 MG tablet Commonly known as:  ADVIL,MOTRIN Take 1 tablet (600 mg total) by mouth every 6 (six) hours as needed for mild pain.   oxyCODONE-acetaminophen 5-325 MG tablet Commonly known as:  PERCOCET/ROXICET Take 2 tablets by mouth every 4 (four) hours as needed (  pain scale > 7).      Outpatient follow up:  Postpartum contraception: Depo-Provera   Discharged Condition: good  Discharged to: home  Newborn Data: Disposition:home with mother  Apgars: APGAR (1 MIN): 8   APGAR (5 MINS): 9   APGAR (10 MINS):    Baby Feeding: Bottle  Philip Aspen, CNM 11/27/2016 8:00 AM

## 2016-12-01 ENCOUNTER — Ambulatory Visit (INDEPENDENT_AMBULATORY_CARE_PROVIDER_SITE_OTHER): Payer: Medicaid Other | Admitting: Certified Nurse Midwife

## 2016-12-01 ENCOUNTER — Encounter: Payer: Self-pay | Admitting: Certified Nurse Midwife

## 2016-12-01 VITALS — BP 141/97 | HR 90 | Ht 67.0 in | Wt 225.9 lb

## 2016-12-01 DIAGNOSIS — Z0130 Encounter for examination of blood pressure without abnormal findings: Secondary | ICD-10-CM

## 2016-12-01 DIAGNOSIS — O9081 Anemia of the puerperium: Secondary | ICD-10-CM

## 2016-12-01 DIAGNOSIS — Z4889 Encounter for other specified surgical aftercare: Secondary | ICD-10-CM

## 2016-12-01 MED ORDER — SIMETHICONE 80 MG PO CHEW: 80.0000 mg | CHEWABLE_TABLET | Freq: Four times a day (QID) | ORAL | 0 refills | Status: DC | PRN

## 2016-12-01 NOTE — Patient Instructions (Signed)
Postpartum Hypertension  Postpartum hypertension is high blood pressure after pregnancy that remains higher than normal for more than two days after delivery. You may not realize that you have postpartum hypertension if your blood pressure is not being checked regularly. In some cases, postpartum hypertension will go away on its own, usually within a week of delivery. However, for some women, medical treatment is required to prevent serious complications, such as seizures or stroke.  The following things can affect your blood pressure:  · The type of delivery you had.  · Having received IV fluids or other medicines during or after delivery.    What are the causes?  Postpartum hypertension may be caused by any of the following or by a combination of any of the following:  · Hypertension that existed before pregnancy (chronic hypertension).  · Gestational hypertension.  · Preeclampsia or eclampsia.  · Receiving a lot of fluid through an IV during or after delivery.  · Medicines.  · HELLP syndrome.  · Hyperthyroidism.  · Stroke.  · Other rare neurological or blood disorders.    In some cases, the cause may not be known.  What increases the risk?  Postpartum hypertension can be related to one or more risk factors, such as:  · Chronic hypertension. In some cases, this may not have been diagnosed before pregnancy.  · Obesity.  · Type 2 diabetes.  · Kidney disease.  · Family history of preeclampsia.  · Other medical conditions that cause hormonal imbalances.    What are the signs or symptoms?  As with all types of hypertension, postpartum hypertension may not have any symptoms. Depending on how high your blood pressure is, you may experience:  · Headaches. These may be mild, moderate, or severe. They may also be steady, constant, or sudden in onset (thunderclap headache).  · Visual changes.  · Dizziness.  · Shortness of breath.  · Swelling of your hands, feet, lower legs, or face. In some cases, you may have swelling in  more than one of these locations.  · Heart palpitations or a racing heartbeat.  · Difficulty breathing while lying down.  · Decreased urination.    Other rare signs and symptoms may include:  · Sweating more than usual. This lasts longer than a few days after delivery.  · Chest pain.  · Sudden dizziness when you get up from sitting or lying down.  · Seizures.  · Nausea or vomiting.  · Abdominal pain.    How is this diagnosed?  The diagnosis of postpartum hypertension is made through a combination of physical examination findings and testing of your blood and urine. You may also have additional tests, such as a CT scan or an MRI, to check for other complications of postpartum hypertension.  How is this treated?  When blood pressure is high enough to require treatment, your options may include:  · Medicines to reduce blood pressure (antihypertensives). Tell your health care provider if you are breastfeeding or if you plan to breastfeed. There are many antihypertensive medicines that are safe to take while breastfeeding.  · Stopping medicines that may be causing hypertension.  · Treating medical conditions that are causing hypertension.  · Treating the complications of hypertension, such as seizures, stroke, or kidney problems.    Your health care provider will also continue to monitor your blood pressure closely and repeatedly until it is within a safe range for you.  Follow these instructions at home:  · Take medicines only   as directed by your health care provider.  · Get regular exercise after your health care provider tells you that it is safe.  · Follow your health care provider’s recommendations on fluid and salt restrictions.  · Do not use any tobacco products, including cigarettes, chewing tobacco, or electronic cigarettes. If you need help quitting, ask your health care provider.  · Keep all follow-up visits as directed by your health care provider. This is important.  Contact a health care provider  if:  · Your symptoms get worse.  · You have new symptoms, such as:  ? Headache.  ? Dizziness.  ? Visual changes.  Get help right away if:  · You develop a severe or sudden headache.  · You have seizures.  · You develop numbness or weakness on one side of your body.  · You have difficulty thinking, speaking, or swallowing.  · You develop severe abdominal pain.  · You develop difficulty breathing, chest pain, a racing heartbeat, or heart palpitations.  These symptoms may represent a serious problem that is an emergency. Do not wait to see if the symptoms will go away. Get medical help right away. Call your local emergency services (911 in the [place] Do not drive yourself to the hospital.  This information is not intended to replace advice given to you by your health care provider. Make sure you discuss any questions you have with your health care provider.  Document Released: 03/27/2014 Document Revised: 12/27/2015 Document Reviewed: 02/05/2014  Elsevier Interactive Patient Education © 2017 Elsevier Inc.

## 2016-12-03 DIAGNOSIS — O9081 Anemia of the puerperium: Secondary | ICD-10-CM | POA: Insufficient documentation

## 2016-12-03 NOTE — Progress Notes (Signed)
GYN ENCOUNTER NOTE  Subjective:       Lauren Johnson is a 31 y.o. G31P1000 female here for post op incision check and blood pressure check.   S/p primary c-section for failure to progress after induction for gestational hypertension with severe features.   Lauren Johnson reports gas pain and incision pain. She is only taking tylenol of pain. She "for got about her Motrin and gas pill".   She has swelling to her lower legs. Her sister reports that she is drinking approximately four (4) sodas a day with a "little bit" of water.   Denies difficulty breathing or respiratory distress, chest pain, abdominal pain, excessive vaginal bleeding, and leg pain.   Obstetric History OB History  Gravida Para Term Preterm AB Living  1 1 1  0 0 0  SAB TAB Ectopic Multiple Live Births  0 0 0 0      # Outcome Date GA Lbr Len/2nd Weight Sex Delivery Anes PTL Lv  1 Term 11/25/16 [redacted]w[redacted]d  5 lb 5.7 oz (2.43 kg) F CS-LTranv Spinal        Past Medical History:  Diagnosis Date  . Cervical cancer [facility])     Past Surgical History:  Procedure Laterality Date  . CERVICAL BIOPSY  W/ LOOP ELECTRODE EXCISION    . CESAREAN SECTION N/A 11/25/2016   Procedure: CESAREAN SECTION;  Surgeon: Brayton Mars, MD;  Location: [facility] ORS;  Service: Obstetrics;  Laterality: N/A;  . FRACTURE SURGERY      Current Outpatient Prescriptions on File Prior to Visit  Medication Sig Dispense Refill  . docusate sodium (COLACE) 100 MG capsule Take 1 capsule (100 mg total) by mouth 2 (two) times Johnson. 10 capsule 0  . ferrous sulfate 325 (65 FE) MG tablet Take 1 tablet (325 mg total) by mouth 2 (two) times Johnson with a meal. 31 tablet 3  . ibuprofen (ADVIL,MOTRIN) 600 MG tablet Take 1 tablet (600 mg total) by mouth every 6 (six) hours as needed for mild pain. 30 tablet 0  . oxyCODONE-acetaminophen (PERCOCET/ROXICET) 5-325 MG tablet Take 2 tablets by mouth every 4 (four) hours as needed (pain scale > 7). 30 tablet 0   No current  facility-administered medications on file prior to visit.     Allergies  Allergen Reactions  . Codeine Hives  . Latex Other (See Comments)    Throat swells    Social History   Social History  . Marital status: Single    Spouse name: N/A  . Number of children: N/A  . Years of education: N/A   Occupational History  . Not on file.   Social History Main Topics  . Smoking status: Current Every Day Smoker    Packs/day: 0.50  . Smokeless tobacco: Never Used  . Alcohol use No     Comment: rare  . Drug use: No     Comment: "its been a few months" since last use  . Sexual activity: Yes    Birth control/ protection: None   Other Topics Concern  . Not on file   Social History Narrative  . No narrative on file    History reviewed. No pertinent family history.  The following portions of the patient's history were reviewed and updated as appropriate: allergies, current medications, past family history, past medical history, past social history, past surgical history and problem list.  Review of Systems  Review of Systems - Negative except as noted above.  History obtained from sibling and the patient.  Objective:   BP (!) 141/97 (BP Location: Left Arm, Patient Position: Sitting, Cuff Size: Normal)   Pulse 90   Ht 5\' 7"  (1.702 m)   Wt 225 lb 14.4 oz (102.5 kg)   LMP 02/21/2016   Breastfeeding? No   BMI 35.38 kg/m    CONSTITUTIONAL: Well-developed, well-nourished female in no acute distress.   ABDOMEN: Soft, non distended; Non tender.  No Organomegaly. Incision clean, dry, well approximated, covered by steri strips.   DVT Evaluation: Negative Homan's sign. No cords or calf tenderness. Calf/Ankle edema is present.  Assessment:   1. Encounter for post surgical wound check  2. Blood pressure check  Plan:   Advised routine post c-section wound care including gently washing with antibacterial soap, patting the area dry, not wearing underwear or pants that rub  site, and allowing steri strips to fall off over the next week.   Routine postpartum education including self care, breast care, and perineum care.   Rx Simethicone, see orders.  Reviewed red flag symptoms and when to call.   RTC x 2 weeks for blood pressure check.   RTC x 5 weeks for PPV.    Diona Fanti, CNM

## 2016-12-09 LAB — TYPE AND SCREEN
ABO/RH(D): O POS
Antibody Screen: NEGATIVE

## 2017-01-10 ENCOUNTER — Encounter: Payer: Self-pay | Admitting: Certified Nurse Midwife

## 2017-01-10 ENCOUNTER — Ambulatory Visit (INDEPENDENT_AMBULATORY_CARE_PROVIDER_SITE_OTHER): Payer: Medicaid Other | Admitting: Certified Nurse Midwife

## 2017-01-10 MED ORDER — MEDROXYPROGESTERONE ACETATE 150 MG/ML IM SUSP: 150.0000 mg | INTRAMUSCULAR | 3 refills | Status: DC

## 2017-01-10 MED ORDER — SERTRALINE HCL 50 MG PO TABS: 50.0000 mg | ORAL_TABLET | Freq: Every day | ORAL | 0 refills | Status: DC

## 2017-01-10 NOTE — Progress Notes (Signed)
Subjective:    Lauren Johnson is a 31 y.o. G64P1000 Caucasian female who presents for a postpartum visit. She is 5 weeks postpartum following a primary cesarean section, low transverse incision at 39.4 gestational weeks. Anesthesia: spinal. I have fully reviewed the prenatal and intrapartum course. Postpartum course has been unremarkable. Baby's course has been normal. Baby is feeding by bottle. Bleeding no bleeding. Bowel function is normal. Bladder function is normal. Patient is sexually active. Last sexual activity: a few days ago. Contraception method is condoms. Postpartum depression screening: minor depression. Score 14.  Last pap 10/30/2017and was negative.  The following portions of the patient's history were reviewed and updated as appropriate: allergies, current medications, past medical history, past surgical history and problem list.  Review of Systems Pertinent items are noted in HPI.   Vitals:   01/10/17 1349  BP: 121/81  Pulse: 73  Weight: 211 lb 12.8 oz (96.1 kg)   No LMP recorded.  Objective:   General:  alert, cooperative and no distress   Breasts:  deferred, no complaints  Lungs: clear to auscultation bilaterally  Heart:  regular rate and rhythm  Abdomen: soft, non tender, scar dry, healed    Vulva: normal  Vagina: normal vagina  Cervix:  closed  Corpus: Well-involuted  Adnexa:  Non-palpable  Rectal Exam: no hemorrhoids        Assessment:   Postpartum exam 5 wks s/p  bottlefeeding Depression screening positive  Contraception counseling: depo ordered  Plan:  : Depo-Provera injections, ordered to call for nurse visit. Discussed PHQ-9 score. Pt states that she is tearful, exhausted , anxious, and short tempered. Reviewed PPD and options for treatment. She is interested medication to help. Zoloft 50 mg prescribed. Red flag symptoms reviewed. She agrees to go to the ED if thoughts of hurting herself or baby. Follow up in: 2 weeks for medication check or  earlier if needed.   Philip Aspen, CNM

## 2017-01-10 NOTE — Patient Instructions (Signed)
Preventive Care 31-39 Years, Female Preventive care refers to lifestyle choices and visits with your health care provider that can promote health and wellness. What does preventive care include?  A yearly physical exam. This is also called an annual well check.  Dental exams once or twice a year.  Routine eye exams. Ask your health care provider how often you should have your eyes checked.  Personal lifestyle choices, including: ? Daily care of your teeth and gums. ? Regular physical activity. ? Eating a healthy diet. ? Avoiding tobacco and drug use. ? Limiting alcohol use. ? Practicing safe sex. ? Taking vitamin and mineral supplements as recommended by your health care provider. What happens during an annual well check? The services and screenings done by your health care provider during your annual well check will depend on your age, overall health, lifestyle risk factors, and family history of disease. Counseling Your health care provider may ask you questions about your:  Alcohol use.  Tobacco use.  Drug use.  Emotional well-being.  Home and relationship well-being.  Sexual activity.  Eating habits.  Work and work Statistician.  Method of birth control.  Menstrual cycle.  Pregnancy history.  Screening You may have the following tests or measurements:  Height, weight, and BMI.  Diabetes screening. This is done by checking your blood sugar (glucose) after you have not eaten for a while (fasting).  Blood pressure.  Lipid and cholesterol levels. These may be checked every 5 years starting at age 66.  Skin check.  Hepatitis C blood test.  Hepatitis B blood test.  Sexually transmitted disease (STD) testing.  BRCA-related cancer screening. This may be done if you have a family history of breast, ovarian, tubal, or peritoneal cancers.  Pelvic exam and Pap test. This may be done every 3 years starting at age 31. Starting at age 31, this may be done every 5  years if you have a Pap test in combination with an HPV test.  Discuss your test results, treatment options, and if necessary, the need for more tests with your health care provider. Vaccines Your health care provider may recommend certain vaccines, such as:  Influenza vaccine. This is recommended every year.  Tetanus, diphtheria, and acellular pertussis (Tdap, Td) vaccine. You may need a Td booster every 10 years.  Varicella vaccine. You may need this if you have not been vaccinated.  HPV vaccine. If you are 31 or younger, you may need three doses over 6 months.  Measles, mumps, and rubella (MMR) vaccine. You may need at least one dose of MMR. You may also need a second dose.  Pneumococcal 13-valent conjugate (PCV13) vaccine. You may need this if you have certain conditions and were not previously vaccinated.  Pneumococcal polysaccharide (PPSV23) vaccine. You may need one or two doses if you smoke cigarettes or if you have certain conditions.  Meningococcal vaccine. One dose is recommended if you are age 27-21 years and a first-year college student living in a residence hall, or if you have one of several medical conditions. You may also need additional booster doses.  Hepatitis A vaccine. You may need this if you have certain conditions or if you travel or work in places where you may be exposed to hepatitis A.  Hepatitis B vaccine. You may need this if you have certain conditions or if you travel or work in places where you may be exposed to hepatitis B.  Haemophilus influenzae type b (Hib) vaccine. You may need this if  you have certain risk factors.  Talk to your health care provider about which screenings and vaccines you need and how often you need them. This information is not intended to replace advice given to you by your health care provider. Make sure you discuss any questions you have with your health care provider. Document Released: 09/19/2001 Document Revised: 04/12/2016  Document Reviewed: 05/25/2015 Elsevier Interactive Patient Education  2017 [employer].

## 2017-01-24 ENCOUNTER — Encounter: Payer: Medicaid Other | Admitting: Certified Nurse Midwife

## 2017-03-09 ENCOUNTER — Ambulatory Visit (INDEPENDENT_AMBULATORY_CARE_PROVIDER_SITE_OTHER): Payer: Self-pay | Admitting: Certified Nurse Midwife

## 2017-03-09 ENCOUNTER — Encounter: Payer: Self-pay | Admitting: Certified Nurse Midwife

## 2017-03-09 ENCOUNTER — Ambulatory Visit (INDEPENDENT_AMBULATORY_CARE_PROVIDER_SITE_OTHER): Payer: Medicaid Other | Admitting: Certified Nurse Midwife

## 2017-03-09 VITALS — BP 89/65 | HR 72 | Ht 67.0 in | Wt 213.4 lb

## 2017-03-09 DIAGNOSIS — Z30013 Encounter for initial prescription of injectable contraceptive: Secondary | ICD-10-CM | POA: Diagnosis not present

## 2017-03-09 DIAGNOSIS — M549 Dorsalgia, unspecified: Secondary | ICD-10-CM

## 2017-03-09 DIAGNOSIS — R454 Irritability and anger: Secondary | ICD-10-CM

## 2017-03-09 LAB — POCT URINE PREGNANCY: Preg Test, Ur: NEGATIVE

## 2017-03-09 MED ORDER — MEDROXYPROGESTERONE ACETATE 150 MG/ML IM SUSP
150.0000 mg | Freq: Once | INTRAMUSCULAR | Status: AC
  Administered 2017-03-09: 150 mg via INTRAMUSCULAR

## 2017-03-09 MED ORDER — MEDROXYPROGESTERONE ACETATE 150 MG/ML IM SUSP: 150.0000 mg | INTRAMUSCULAR | 3 refills | Status: AC

## 2017-03-09 MED ORDER — SERTRALINE HCL 50 MG PO TABS: 50.0000 mg | ORAL_TABLET | Freq: Every day | ORAL | 0 refills | Status: DC

## 2017-03-09 MED ORDER — CITALOPRAM HYDROBROMIDE 20 MG PO TABS: 20.0000 mg | ORAL_TABLET | Freq: Every day | ORAL | 0 refills | Status: AC

## 2017-03-09 NOTE — Patient Instructions (Signed)
Tips for Managing Your Anger HOW CAN ANGER AFFECT MY LIFE? Everyone feels angry from time to time. It is okay and normal to feel angry. However, the way that you behave or react to anger can make it a problem. If you react too strongly to anger or you cannot control your anger, that can cause relationships problems at home and work. Anger can also affect your health. Uncontrolled anger increases your risk of heart disease. When you are angry, your heart rate and blood pressure rise. Levels of certain energy hormones, such as adrenaline, also increase. When this happens, your heart has to work harder. In extreme cases, anger can cause the blood vessels to become narrow. This reduces the supply of blood and oxygen to the heart, and that can trigger chest pain (angina). Anger can also trigger stress-related problems, such as:  Headaches.  Poor digestion.  Trouble sleeping (insomnia).  WHAT ACTIONS CAN I TAKE TO HELP MANAGE MY ANGER? You can take actions to help you manage your anger. For example:  Express your anger. When you express your anger in a healthy way, it is a form of communication. The following strategies can help you to express your anger in a healthy way and when you are ready to do so: ? Step away. When you are feeling reactive, it may take at least 20 minutes for your body to return to its normal blood pressure and heart rate. To help your body do this, take a walk, listen to music, stretch, take deep breaths, and avoid the situation or person who is making you angry. Try to only discuss your anger when you feel calm again. ? Try to consider how others feel before you react. Avoid swearing, sighing, raising your voice, or blaming. ? Choose a good time to work through problems. You may be more likely to lose your temper at the end of the day when you are tired. ? Keep an anger journal. Writing down the situations that make you angry can help you figure out what triggers your anger and  why.  Consider changing your perception. Is there another way you can view the situation that will leave you with a different emotion? Sometimes, changing the way you think about a situation can make it seem less infuriating. Here are some ways to do that: ? Remind yourself that everyone is not out to get you. ? Remind yourself that a disappointing result is not the end of the world. ? Take steps to solve or prevent the situation that upsets you. ? Find the humor in an aggravating situation. ? Deal with the physical effects by taking deep breaths, exercising, or taking a walk. ? Slowly repeat the word "relax" or another calming phrase. ? Picture a relaxing image in your mind. Close your eyes and use that image to help you calm yourself.  WHEN SHOULD I SEEK ADDITIONAL HELP? Anger becomes a problem if it occurs frequently and lasts for long periods of time. You may also need help managing your anger if:  You use physical force or aggression when you are angry and others feel threatened and fearful.  You feel that your anger is out of control.  Anger is interfering with your job.  Anger is causing problems with your health.  Anger is causing problems with your relationships.  Anger is affecting your ability to tolerate normal daily situations, such as sitting in a traffic jam or waiting in line.  You treat others disrespectfully.  You do   not trust people around you.  It may help to ask someone you trust whether he or she thinks you show any of these signs. Sometimes, it can be hard to recognize the problem yourself. WHERE CAN I GET SUPPORT? A psychologist or another licensed mental health professional can help you learn how to manage your anger. Ask your health care provider for a referral, or look online to find a psychologist who specializes in anger management. You can search the websites of many mental health organizations to find a mental health care provider. Local Domestic Abuse  Projects are also available for help. Your local hospital or behavioral counselors in your area may also offer anger management programs or support groups that can help. WHERE CAN I FIND MORE INFORMATION?  The [place] Centers for Disease Control and Prevention: www.cdc.gov/bam/life/getting-along3.html  American Psychological Association: www.apa.org/topics/anger/  The Substance Abuse and Mental Health Services Administration: http://www.samhsa.gov/samhsaNewsLetter/Volume_22_Number_3/working_with_anger/  National Resource Center on Domestic Violence: http://www.nrcdv.org/dvam/  This information is not intended to replace advice given to you by your health care provider. Make sure you discuss any questions you have with your health care provider. Document Released: 05/21/2007 Document Revised: 03/26/2016 Document Reviewed: 05/28/2015 Elsevier Interactive Patient Education  2018 Elsevier Inc.  

## 2017-03-09 NOTE — Progress Notes (Signed)
Patient ID: Lauren Johnson, female   DOB: 1986-07-07, 31 y.o.   MRN: [id number] Pt returns to office today for Depo-provera injection for contraception. UPT-negative. Seen provider earlier today for counciling.

## 2017-03-09 NOTE — Progress Notes (Signed)
GYN ENCOUNTER NOTE  Subjective:       Lauren Johnson is a 31 y.o. G60P1000 female is here for follow up of medication. She was seen 01/10/17 for her 6wk ppv and had signs & symptoms of postpartum depression. She was started on Zoloft 50 mg and was supposed to follow up in 2-3 wks for medication check. She states that she stopped taking the medication after a week because it was not helping. She has not been able get back into the office due to work and child care.    Gynecologic History Patient's last menstrual period was 03/04/2017. Contraception: none, she never got the Depo injection PP.  Last Pap:  06/05/2016. Results were: normal Last mammogram: N/A  Obstetric History OB History  Gravida Para Term Preterm AB Living  1 1 1  0 0 0  SAB TAB Ectopic Multiple Live Births  0 0 0 0      # Outcome Date GA Lbr Len/2nd Weight Sex Delivery Anes PTL Lv  1 Term 11/25/16 [redacted]w[redacted]d  5 lb 5.7 oz (2.43 kg) F CS-LTranv Spinal        Past Medical History:  Diagnosis Date  . Cervical cancer ([place])   . Dysmenorrhea   . Menorrhagia     Past Surgical History:  Procedure Laterality Date  . CERVICAL BIOPSY  W/ LOOP ELECTRODE EXCISION    . CESAREAN SECTION N/A 11/25/2016   Procedure: CESAREAN SECTION;  Surgeon: Brayton Mars, MD;  Location: [facility] ORS;  Service: Obstetrics;  Laterality: N/A;  . FRACTURE SURGERY      No current outpatient prescriptions on file prior to visit.   No current facility-administered medications on file prior to visit.     Allergies  Allergen Reactions  . Codeine Hives  . Latex Other (See Comments)    Throat swells    Social History   Social History  . Marital status: Single    Spouse name: N/A  . Number of children: N/A  . Years of education: N/A   Occupational History  . Not on file.   Social History Main Topics  . Smoking status: Current Every Day Smoker    Packs/day: 0.50  . Smokeless tobacco: Never Used  . Alcohol use No     Comment: rare   . Drug use: No     Comment: "its been a few months" since last use  . Sexual activity: Yes    Birth control/ protection: None   Other Topics Concern  . Not on file   Social History Narrative  . No narrative on file    History reviewed. No pertinent family history.  The following portions of the patient's history were reviewed and updated as appropriate: allergies, current medications, past family history, past medical history, past social history, past surgical history and problem list.  Review of Systems Review of Systems - Negative except as mentioned in HPI Review of Systems - General ROS: negative for - chills, fatigue, fever, hot flashes, malaise or night sweats Hematological and Lymphatic ROS: negative for - bleeding problems or swollen lymph nodes Gastrointestinal ROS: negative for - abdominal pain, blood in stools, change in bowel habits and nausea/vomiting Musculoskeletal ROS: negative for - joint pain, muscle pain or muscular weakness. Positive back pain. Genito-Urinary ROS: negative for - change in menstrual cycle, dysmenorrhea, dyspareunia, dysuria, genital discharge, genital ulcers, hematuria, incontinence, irregular/heavy menses, nocturia or pelvic painjj  Objective:   BP (!) 89/65 (BP Location: Left Arm, Patient Position: Sitting, Cuff  Size: Normal)   Pulse 72   Ht 5\' 7"  (1.702 m)   Wt 213 lb 6.4 oz (96.8 kg)   LMP 03/04/2017   Breastfeeding? No   BMI 33.42 kg/m  CONSTITUTIONAL: Well-developed, well-nourished female in no acute distress.  HENT:  Normocephalic, atraumatic.  NECK: Normal range of motion, SKIN: Skin is warm and dry. No rash noted. Not diaphoretic. No erythema. No pallor. [place]: Alert and oriented to person, place, and time. PSYCHIATRIC: Normal mood and affect. She is tearful, reports difficulty managing anger leading to conflict with friends and family.  CARDIOVASCULAR:Not Examined RESPIRATORY: Not Examined BREASTS: Not Examined ABDOMEN:  Soft, non distended; Non tender.  No Organomegaly. PELVIC: not indicated MUSCULOSKELETAL: Normal range of motion. No tenderness.  No cyanosis, clubbing, or edema.   Assessment:   Postpartum Depression Irritability and anger Bilateral back pain Birth control management.    Plan:   Reviewed medication for treatment of depression/anxiety. Discussed need for continued use and return appointment to evaluate need to increase medication safely. She denies desire to hurt herself or the baby. She is stressed due to being a single mother and having limited support. Encouraged [place] to consider counseling given her anger. She agrees to plan. Referral to behavior health ordered. Start on citalopram 20 mg daily.  Discussed Birth control options, she states that pill not a good option because she forgets to take them. Reviewed IUD's and nexplanon. She declines. She states that Depo is the best option for her. Instructed her that she has to pick up medication and bring into office for administration. She has "kinda of been sexually active" but states that she is not pregnant ( on her period today).  She complain of back pain from repeated epidural placement and asks to see chiropractor. Referral placed. Red Flag symptoms reviewed, encouraged to go to ER if she has thoughts of hurting herself or baby. She agrees to plan.  Follow up in 3 wks or sooner as needed.   I attest more than 50% of this visit spent reviewing pt history, discussing treatment options and developing plan of care.   Philip Aspen, CNM

## 2017-03-16 ENCOUNTER — Encounter: Payer: Medicaid Other | Admitting: Certified Nurse Midwife

## 2017-03-16 ENCOUNTER — Other Ambulatory Visit: Payer: Medicaid Other

## 2017-03-30 ENCOUNTER — Encounter: Payer: Medicaid Other | Admitting: Certified Nurse Midwife

## 2017-05-25 ENCOUNTER — Ambulatory Visit: Payer: Medicaid Other

## 2018-01-15 ENCOUNTER — Encounter: Payer: Medicaid Other | Admitting: Certified Nurse Midwife

## 2020-06-06 ENCOUNTER — Emergency Department
Admission: EM | Admit: 2020-06-06 | Discharge: 2020-06-07 | Disposition: A | Discharge status: Home / Self Care | Payer: Medicaid Other | Attending: Emergency Medicine | Admitting: Emergency Medicine

## 2020-06-06 ENCOUNTER — Emergency Department: Payer: Medicaid Other

## 2020-06-06 ENCOUNTER — Other Ambulatory Visit: Payer: Self-pay

## 2020-06-06 DIAGNOSIS — G501 Atypical facial pain: Secondary | ICD-10-CM | POA: Diagnosis not present

## 2020-06-06 DIAGNOSIS — Z8541 Personal history of malignant neoplasm of cervix uteri: Secondary | ICD-10-CM | POA: Insufficient documentation

## 2020-06-06 DIAGNOSIS — F17210 Nicotine dependence, cigarettes, uncomplicated: Secondary | ICD-10-CM | POA: Diagnosis not present

## 2020-06-06 DIAGNOSIS — Y93I9 Activity, other involving external motion: Secondary | ICD-10-CM | POA: Insufficient documentation

## 2020-06-06 DIAGNOSIS — R519 Headache, unspecified: Secondary | ICD-10-CM | POA: Insufficient documentation

## 2020-06-06 DIAGNOSIS — Y92410 Unspecified street and highway as the place of occurrence of the external cause: Secondary | ICD-10-CM | POA: Diagnosis not present
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
## 2020-06-06 NOTE — ED Notes (Signed)
DSS still talking to patient.  34 year old child in room with mother and DSS. Officer Emogene Morgan also present.  52 month old child being held by nursing and EDPs. Advised mother due to her unsteady gait that staff do not feel comfortable with her holding or carrying child.

## 2020-06-06 NOTE — ED Notes (Signed)
Pt unstable on her feet. Requesting to have both children in the room with her. Pt visibly distraught

## 2020-06-06 NOTE — ED Notes (Signed)
Pt seen stumbling around in room prior to going to CT

## 2020-06-06 NOTE — ED Notes (Addendum)
Lauren Johnson here to speak with patient.  Pt had to be awakened to be interviewed.
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
## 2020-06-06 NOTE — ED Notes (Signed)
[employer] officer talking to patient
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
## 2020-06-06 NOTE — ED Notes (Signed)
Forensic blood draw obtained, site cleansed with betadine, puncture to L AC, pt verbally consented to blood draw. Pt cooperative at this time.  2 tubes labeled by officer.

## 2020-06-06 NOTE — ED Notes (Addendum)
Pt admitted to using xanax to [employer] officer at 1 or 2pm, admitts to it being a pressed pill and green in color oblong rectangle shape

## 2020-06-07 NOTE — ED Notes (Signed)
Pt upset that children have to go out of her custody and with her sister. Sister Anderson Malta in room states the patient still is not acting in her right mind.  Pt states she is going to walk home to [place]. [employer] officer Emogene Morgan in room.
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
## 2022-05-16 ENCOUNTER — Encounter: Payer: Self-pay | Admitting: Certified Nurse Midwife
